# Patient Record
Sex: Female | Born: 1980 | Race: White | Hispanic: No | Smoking: Current every day smoker
Health system: Southern US, Community
[De-identification: ages and names within clinical notes are randomized; demographics above are authoritative.]

## PROBLEM LIST (undated history)

## (undated) DIAGNOSIS — N83209 Unspecified ovarian cyst, unspecified side: Secondary | ICD-10-CM

## (undated) DIAGNOSIS — I10 Essential (primary) hypertension: Secondary | ICD-10-CM

## (undated) DIAGNOSIS — N200 Calculus of kidney: Secondary | ICD-10-CM

## (undated) DIAGNOSIS — E669 Obesity, unspecified: Secondary | ICD-10-CM

## (undated) HISTORY — PX: TONSILLECTOMY: SUR1361

## (undated) HISTORY — PX: LAPAROSCOPY: SHX197

---

## 2016-04-25 DIAGNOSIS — G43909 Migraine, unspecified, not intractable, without status migrainosus: Secondary | ICD-10-CM | POA: Insufficient documentation

## 2016-04-25 DIAGNOSIS — F419 Anxiety disorder, unspecified: Secondary | ICD-10-CM | POA: Insufficient documentation

## 2016-04-25 DIAGNOSIS — F339 Major depressive disorder, recurrent, unspecified: Secondary | ICD-10-CM | POA: Insufficient documentation

## 2016-10-11 ENCOUNTER — Encounter (HOSPITAL_COMMUNITY): Payer: Self-pay | Admitting: Emergency Medicine

## 2016-10-11 DIAGNOSIS — N200 Calculus of kidney: Secondary | ICD-10-CM | POA: Insufficient documentation

## 2016-10-11 DIAGNOSIS — F17200 Nicotine dependence, unspecified, uncomplicated: Secondary | ICD-10-CM | POA: Insufficient documentation

## 2016-10-11 DIAGNOSIS — R109 Unspecified abdominal pain: Secondary | ICD-10-CM | POA: Insufficient documentation

## 2016-10-11 NOTE — ED Triage Notes (Signed)
Patient reports RLQ pain radiating to right lower back with emesis onset this afternoon , denies fever or diarrhea .

## 2016-10-12 ENCOUNTER — Emergency Department (HOSPITAL_COMMUNITY)
Admission: EM | Admit: 2016-10-12 | Discharge: 2016-10-12 | Disposition: A | Discharge status: Home / Self Care | Payer: Self-pay | Attending: Emergency Medicine | Admitting: Emergency Medicine

## 2016-10-12 ENCOUNTER — Emergency Department (HOSPITAL_COMMUNITY): Payer: Self-pay

## 2016-10-12 DIAGNOSIS — R109 Unspecified abdominal pain: Secondary | ICD-10-CM

## 2016-10-12 DIAGNOSIS — N200 Calculus of kidney: Secondary | ICD-10-CM

## 2016-10-12 DIAGNOSIS — R1031 Right lower quadrant pain: Secondary | ICD-10-CM

## 2016-10-12 HISTORY — DX: Obesity, unspecified: E66.9

## 2016-10-12 HISTORY — DX: Unspecified ovarian cyst, unspecified side: N83.209

## 2016-10-12 HISTORY — DX: Calculus of kidney: N20.0

## 2016-10-12 LAB — URINALYSIS, ROUTINE W REFLEX MICROSCOPIC
Bacteria, UA: NONE SEEN
Bilirubin Urine: NEGATIVE
GLUCOSE, UA: NEGATIVE mg/dL
Ketones, ur: 20 mg/dL — AB
Leukocytes, UA: NEGATIVE
NITRITE: NEGATIVE
Protein, ur: 100 mg/dL — AB
SPECIFIC GRAVITY, URINE: 1.019 (ref 1.005–1.030)
WBC, UA: NONE SEEN WBC/hpf (ref 0–5)
pH: 6 (ref 5.0–8.0)

## 2016-10-12 LAB — CBC
HCT: 43.8 % (ref 36.0–46.0)
Hemoglobin: 14.8 g/dL (ref 12.0–15.0)
MCH: 30.5 pg (ref 26.0–34.0)
MCHC: 33.8 g/dL (ref 30.0–36.0)
MCV: 90.3 fL (ref 78.0–100.0)
PLATELETS: 288 10*3/uL (ref 150–400)
RBC: 4.85 MIL/uL (ref 3.87–5.11)
RDW: 13.3 % (ref 11.5–15.5)
WBC: 11.4 10*3/uL — ABNORMAL HIGH (ref 4.0–10.5)

## 2016-10-12 LAB — COMPREHENSIVE METABOLIC PANEL
ALBUMIN: 4 g/dL (ref 3.5–5.0)
ALK PHOS: 80 U/L (ref 38–126)
ALT: 15 U/L (ref 14–54)
ANION GAP: 8 (ref 5–15)
AST: 22 U/L (ref 15–41)
BILIRUBIN TOTAL: 0.7 mg/dL (ref 0.3–1.2)
BUN: 11 mg/dL (ref 6–20)
CALCIUM: 9.3 mg/dL (ref 8.9–10.3)
CO2: 23 mmol/L (ref 22–32)
CREATININE: 0.78 mg/dL (ref 0.44–1.00)
Chloride: 107 mmol/L (ref 101–111)
GFR calc Af Amer: >60 mL/min (ref >60)
GFR calc non Af Amer: >60 mL/min (ref >60)
GLUCOSE: 106 mg/dL — AB (ref 65–99)
Potassium: 4.2 mmol/L (ref 3.5–5.1)
Sodium: 138 mmol/L (ref 135–145)
TOTAL PROTEIN: 7.2 g/dL (ref 6.5–8.1)

## 2016-10-12 LAB — I-STAT BETA HCG BLOOD, ED (MC, WL, AP ONLY)

## 2016-10-12 LAB — LIPASE, BLOOD: Lipase: 26 U/L (ref 11–51)

## 2016-10-12 MED ORDER — ONDANSETRON HCL 4 MG/2ML IJ SOLN
4.0000 mg | Freq: Once | INTRAMUSCULAR | Status: AC
  Administered 2016-10-12: 4 mg via INTRAVENOUS
  Filled 2016-10-12: qty 2

## 2016-10-12 MED ORDER — NAPROXEN 500 MG PO TABS: 500.0000 mg | ORAL_TABLET | Freq: Two times a day (BID) | ORAL | 0 refills | Status: DC

## 2016-10-12 MED ORDER — MORPHINE SULFATE (PF) 4 MG/ML IV SOLN
4.0000 mg | Freq: Once | INTRAVENOUS | Status: AC
  Administered 2016-10-12: 4 mg via INTRAVENOUS
  Filled 2016-10-12: qty 1

## 2016-10-12 MED ORDER — KETOROLAC TROMETHAMINE 30 MG/ML IJ SOLN
30.0000 mg | Freq: Once | INTRAMUSCULAR | Status: AC
  Administered 2016-10-12: 30 mg via INTRAVENOUS
  Filled 2016-10-12: qty 1

## 2016-10-12 MED ORDER — ONDANSETRON HCL 4 MG PO TABS: 4.0000 mg | ORAL_TABLET | Freq: Three times a day (TID) | ORAL | 0 refills | Status: DC | PRN

## 2016-10-12 MED ORDER — SODIUM CHLORIDE 0.9 % IV BOLUS (SEPSIS)
1000.0000 mL | Freq: Once | INTRAVENOUS | Status: AC
  Administered 2016-10-12: 1000 mL via INTRAVENOUS

## 2016-10-12 MED ORDER — HYDROCODONE-ACETAMINOPHEN 5-325 MG PO TABS: 1.0000 | ORAL_TABLET | Freq: Four times a day (QID) | ORAL | 0 refills | Status: DC | PRN

## 2016-10-12 MED ORDER — ONDANSETRON 4 MG PO TBDP
4.0000 mg | ORAL_TABLET | Freq: Once | ORAL | Status: AC
  Administered 2016-10-12: 4 mg via ORAL
  Filled 2016-10-12: qty 1

## 2016-10-12 MED ORDER — HYDROCODONE-ACETAMINOPHEN 5-325 MG PO TABS
1.0000 | ORAL_TABLET | Freq: Once | ORAL | Status: AC
  Administered 2016-10-12: 1 via ORAL
  Filled 2016-10-12: qty 1

## 2016-10-12 NOTE — Discharge Instructions (Addendum)
It was my pleasure taking care of you today!   You have been diagnosed with kidney stones. Drink plenty of fluids to help you pass the stone.  Take naproxen as directed with food for mild to moderate pain. Use your pain medication as directed and only as needed for severe pain.   Follow up with your primary care doctor in regards to your hospital visit.   Return to the ED immediately if you develop fever that persists > 101, uncontrolled pain or vomiting, or other concerns.   Do not drink alcohol, drive or participate in any other potentially dangerous activities while taking opiate pain medication as it may make you sleepy. Do not take this medication with any other sedating medications, either prescription or over-the-counter. If you were prescribed Percocet or Vicodin, do not take these with acetaminophen (Tylenol) as it is already contained within these medications.   This medication is an opiate (or narcotic) pain medication and can be habit forming.  Use it as little as possible to achieve adequate pain control.  Do not use or use it with extreme caution if you have a history of opiate abuse or dependence. This medication is intended for your use only - do not give any to anyone else and keep it in a secure place where nobody else, especially children, have access to it. It will also cause or worsen constipation, so you may want to consider taking an over-the-counter stool softener while you are taking this medication.

## 2016-10-12 NOTE — ED Notes (Signed)
Patient transported to CT 

## 2016-10-12 NOTE — ED Provider Notes (Signed)
Care assumed from previous provider PA Law. Please see note for further details. Briefly, patient is a 36 y.o. female who presented to ER for acute onset of right lower quadrant pain radiating to flank. Pelvic exam performed by prior provider which was unremarkable. Patient gets routine screening and declined wet prep / G&C. Small amount of clear discharge and no adnexal or cervical motion tenderness. Case discussed, plan agreed upon. Labs and urine reassuring. Clinical concern for kidney stone. Will follow up on pending CT and reevaluate.   CT reviewed showing 3 mm right ureteropelvic junction stone with mild hydronephrosis. Patient reevaluated and updated on CT findings. Patient states that pain and nausea are beginning to return. Transitioned to oral pain/nausea control. On reevaluation, patient's pain and nausea are controlled. She feels comfortable with discharge to home. Discussed reasons to return to ER in home care instructions. All questions answered.   Mcihael Hinderman, Chase PicketJaime Pilcher, PA-C 10/12/16 [phone]    Bethann BerkshireZammit, Joseph, MD 10/12/16 1536

## 2016-10-12 NOTE — ED Provider Notes (Signed)
[facility]-EMERGENCY DEPT Provider Note   CSN: [id number] Arrival date & time: 10/11/16  2321     History   Chief Complaint Chief Complaint  Patient presents with  . Abdominal Pain    HPI Karen Parks is a 36 y.o. female with history of kidney stone, ovarian cysts who presents with acute onset right lower quadrant and right flank pain that began this afternoon. Patient reports she was laying down watching flex when her pain started. She reports a severe sharp pain that is worse with movement. Patient has had associated blood in her urine. She has also had associated nausea and vomiting. She has tried to take ibuprofen at home prior to arrival, but vomited it up. She reports having 1 kidney stone in the past and notes that this pain is in a little bit different location, but cannot qualify this pain is similar. Patient denies any fevers, chest pain, shortness of breath, diarrhea, constipation, abnormal vaginal bleeding or discharge. Patient is not currently on her menstrual cycle and does not get a regular cycle and she has an IUD.  HPI  Past Medical History:  Diagnosis Date  . Kidney stone   . Obesity   . Obesity   . Ovarian cyst     There are no active problems to display for this patient.   Past Surgical History:  Procedure Laterality Date  . LAPAROSCOPY    . TONSILLECTOMY      OB History    No data available       Home Medications    Prior to Admission medications   Not on File    Family History No family history on file.  Social History Social History  Substance Use Topics  . Smoking status: Current Every Day Smoker  . Smokeless tobacco: Never Used  . Alcohol use Yes     Allergies   Patient has no known allergies.   Review of Systems Review of Systems  Constitutional: Negative for chills and fever.  HENT: Negative for facial swelling and sore throat.   Respiratory: Negative for shortness of breath.   Cardiovascular: Negative for chest pain.    Gastrointestinal: Positive for abdominal pain, nausea and vomiting.  Genitourinary: Positive for flank pain and hematuria. Negative for dysuria.  Musculoskeletal: Positive for back pain.  Skin: Negative for rash and wound.  Neurological: Negative for headaches.  Psychiatric/Behavioral: The patient is not nervous/anxious.      Physical Exam Updated Vital Signs BP (!) 154/93 (BP Location: Right Arm)   Pulse 97   Temp 98 F (36.7 C) (Oral)   Resp 18   SpO2 100%   Physical Exam  Constitutional: She appears well-developed and well-nourished. No distress.  HENT:  Head: Normocephalic and atraumatic.  Mouth/Throat: Oropharynx is clear and moist. No oropharyngeal exudate.  Eyes: Conjunctivae are normal. Pupils are equal, round, and reactive to light. Right eye exhibits no discharge. Left eye exhibits no discharge. No scleral icterus.  Neck: Normal range of motion. Neck supple. No thyromegaly present.  Cardiovascular: Normal rate, regular rhythm, normal heart sounds and intact distal pulses.  Exam reveals no gallop and no friction rub.   No murmur heard. Pulmonary/Chest: Effort normal and breath sounds normal. No stridor. No respiratory distress. She has no wheezes. She has no rales.  Abdominal: Soft. Bowel sounds are normal. She exhibits no distension. There is tenderness in the right lower quadrant. There is CVA tenderness (R). There is no rebound, no guarding and negative Murphy's sign.  Genitourinary: Cervix exhibits no motion tenderness. Right adnexum displays no mass, no tenderness and no fullness. Left adnexum displays no mass, no tenderness and no fullness. No bleeding in the vagina. Vaginal discharge (white/clear; probable normal, physiologic) found.  Genitourinary Comments: Chaperone present; IUD strings visualized at cervical os  Musculoskeletal: She exhibits no edema.  Lymphadenopathy:    She has no cervical adenopathy.  Neurological: She is alert. Coordination normal.   Skin: Skin is warm and dry. No rash noted. She is not diaphoretic. No pallor.  Psychiatric: She has a normal mood and affect.  Nursing note and vitals reviewed.    ED Treatments / Results  Labs (all labs ordered are listed, but only abnormal results are displayed) Labs Reviewed  COMPREHENSIVE METABOLIC PANEL - Abnormal; Notable for the following:       Result Value   Glucose, Bld 106 (*)    All other components within normal limits  CBC - Abnormal; Notable for the following:    WBC 11.4 (*)    All other components within normal limits  URINALYSIS, ROUTINE W REFLEX MICROSCOPIC - Abnormal; Notable for the following:    APPearance HAZY (*)    Hgb urine dipstick LARGE (*)    Ketones, ur 20 (*)    Protein, ur 100 (*)    Squamous Epithelial / LPF 0-5 (*)    All other components within normal limits  LIPASE, BLOOD  I-STAT BETA HCG BLOOD, ED ([facility], [facility], [facility] ONLY)    EKG  EKG Interpretation None       Radiology No results found.  Procedures Procedures (including critical care time)  Medications Ordered in ED Medications  sodium chloride 0.9 % bolus 1,000 mL (1,000 mLs Intravenous New Bag/Given 10/12/16 0541)  morphine 4 MG/ML injection 4 mg (4 mg Intravenous Given 10/12/16 0541)  ondansetron (ZOFRAN) injection 4 mg (4 mg Intravenous Given 10/12/16 0541)     Initial Impression / Assessment and Plan / ED Course  I have reviewed the triage vital signs and the nursing notes.  Pertinent labs & imaging results that were available during my care of the patient were reviewed by me and considered in my medical decision making (see chart for details).     Patient with right lower quadrant and right flank pain. UA shows large hematuria. CBC shows WBC 11.4. CMP WNL. Lipase 26. Pregnancy negative. Patient offered STD testing and other swabs on pelvic exam, however she declined and states she gets tested regularly and has had no changes in discharge. I feel this is reasonable, as patient  most likely suffering kidney stone. CT renal stone study pending. At shift change, [facility] PA-C for continued evaluation, follow up of CT renal stone study and determination of disposition. I discussed patient case with Dr. Elesa MassedWard who guided the patient's management and agrees with plan.   Final Clinical Impressions(s) / ED Diagnoses   Final diagnoses:  Right lower quadrant abdominal pain  Right flank pain    New Prescriptions New Prescriptions   No medications on file     Emi HolesLaw, Drakkar Medeiros M, Cordelia Poche-C 10/12/16 [id number]    Ward, Layla MawKristen N, DO 10/12/16 [phone]

## 2018-11-25 IMAGING — CT CT RENAL STONE PROTOCOL
2 of 4 series · 16 of 46 positions shown, 18 images · non-contrast
Comparison: None.

CLINICAL DATA: Right lower quadrant pain

EXAM:
CT ABDOMEN AND PELVIS WITHOUT CONTRAST
TECHNIQUE: Multidetector CT imaging of the abdomen and pelvis was performed
following the standard protocol without IV contrast.

[Series 4: renal stone 5.0 · axial · 0.74mm/px · z∈[+952,+1392]mm · 13 of 96 slices shown, 15 images]
[im 4/96  soft-tissue]
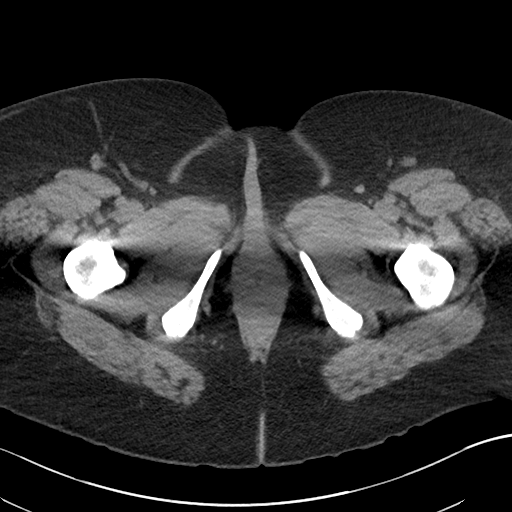
[im 4/96  bone]
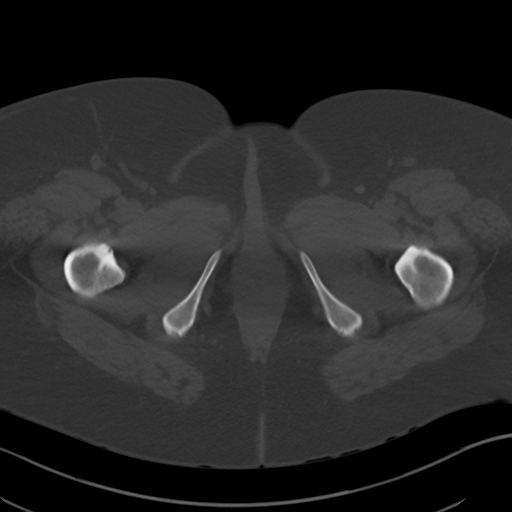
[im 12/96  soft-tissue]
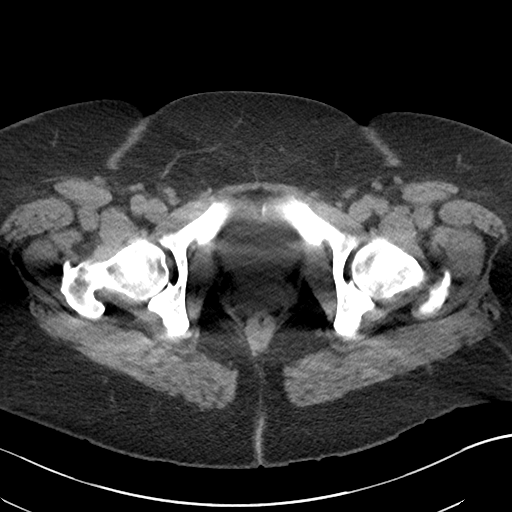
[im 20/96  soft-tissue]
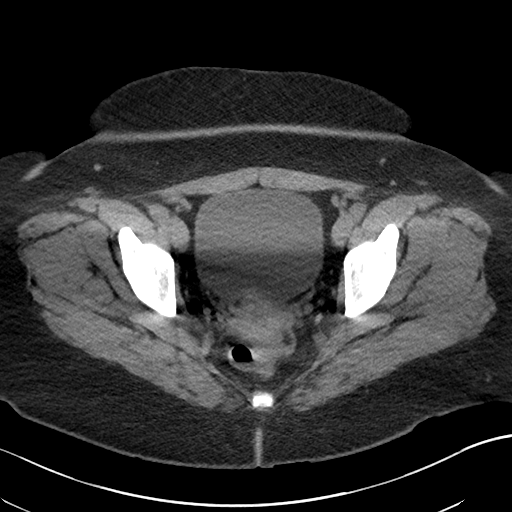
[im 27/96  soft-tissue]
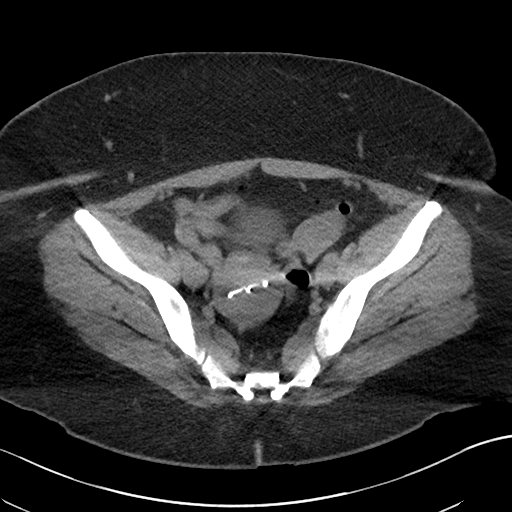
[im 35/96  soft-tissue]
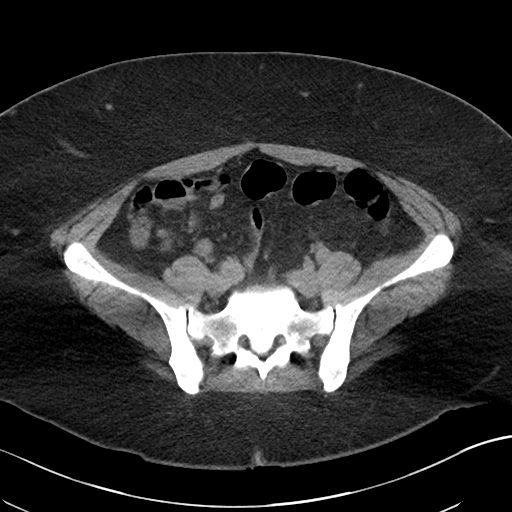
[im 42/96  soft-tissue]
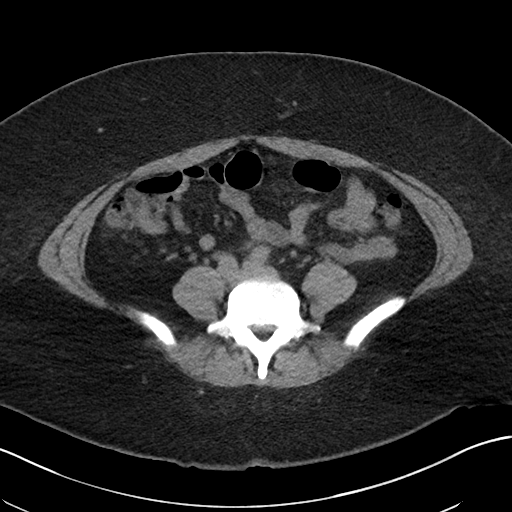
[im 50/96  soft-tissue]
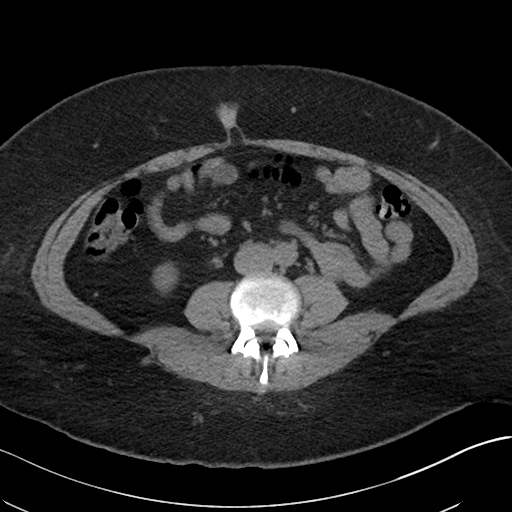
[im 54/96  soft-tissue]
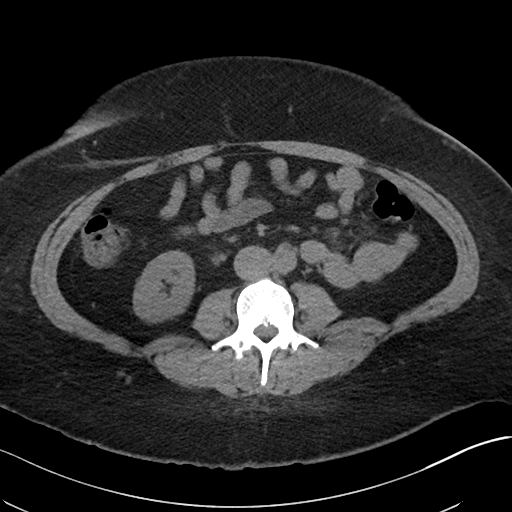
[im 61/96  soft-tissue]
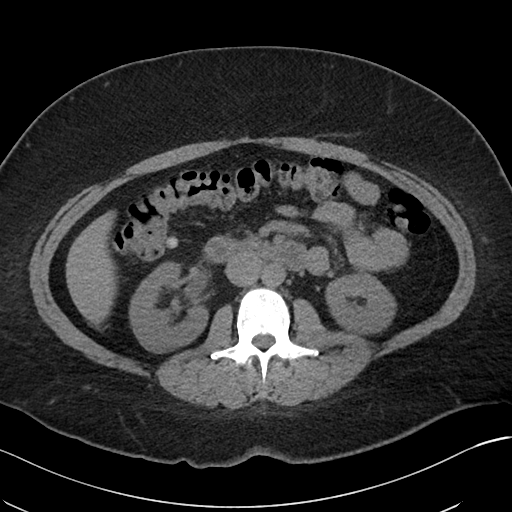
[im 61/96  bone]
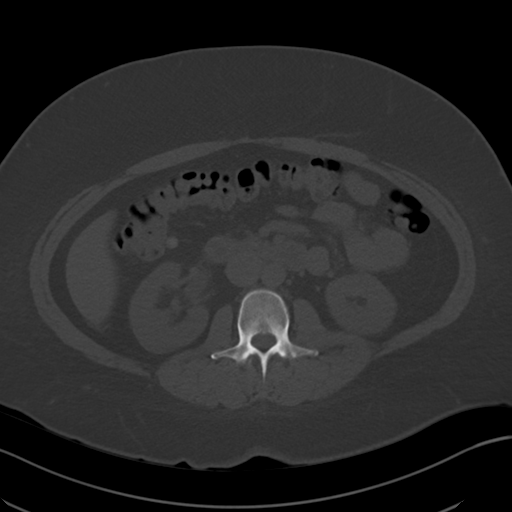
[im 69/96  soft-tissue]
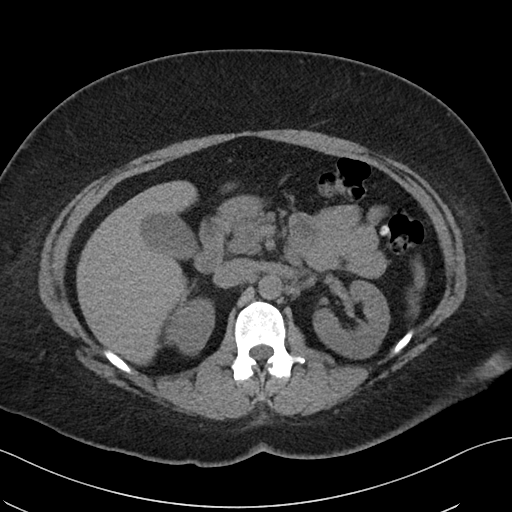
[im 77/96  soft-tissue]
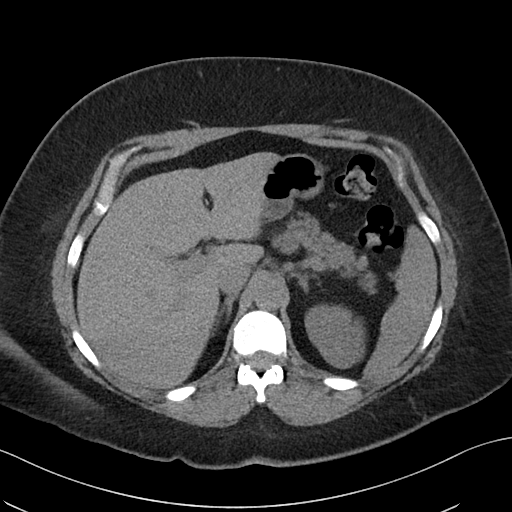
[im 84/96  soft-tissue]
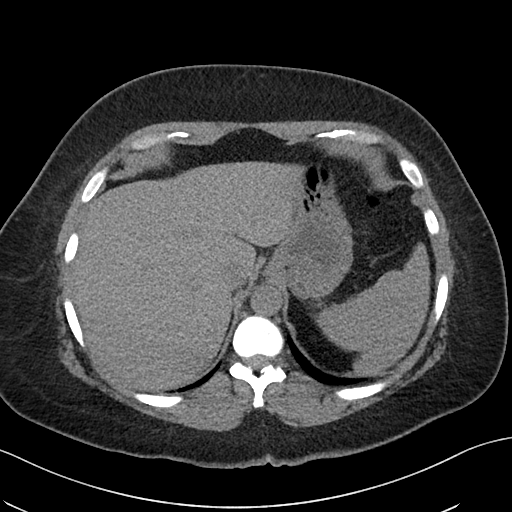
[im 92/96  soft-tissue]
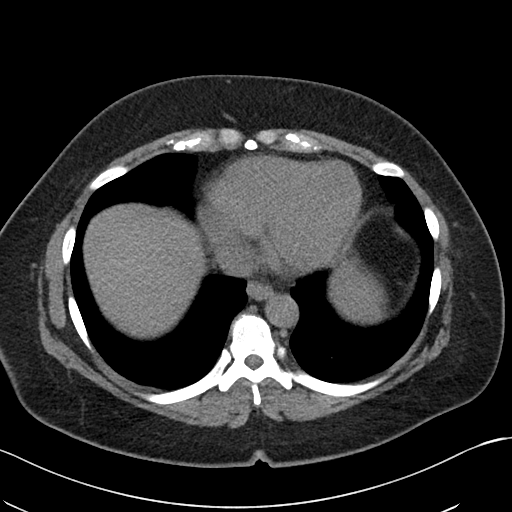

[Series 5: renal stone 3.0 cor · coronal · 0.72mm/px · 3 of 101 slices shown]
[im 34/101  soft-tissue]
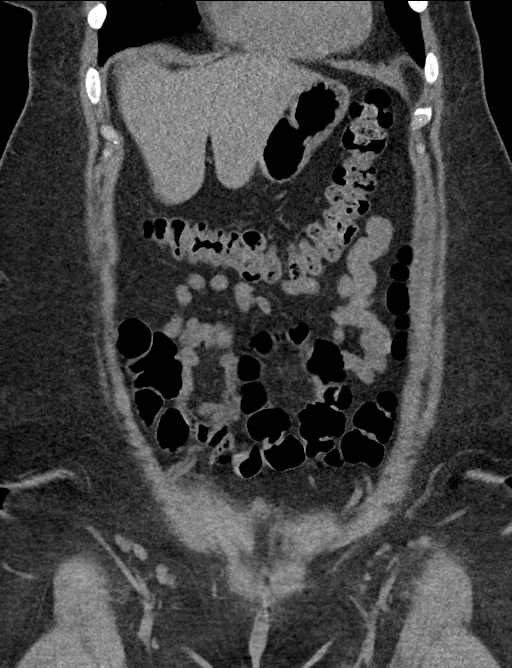
[im 45/101  soft-tissue]
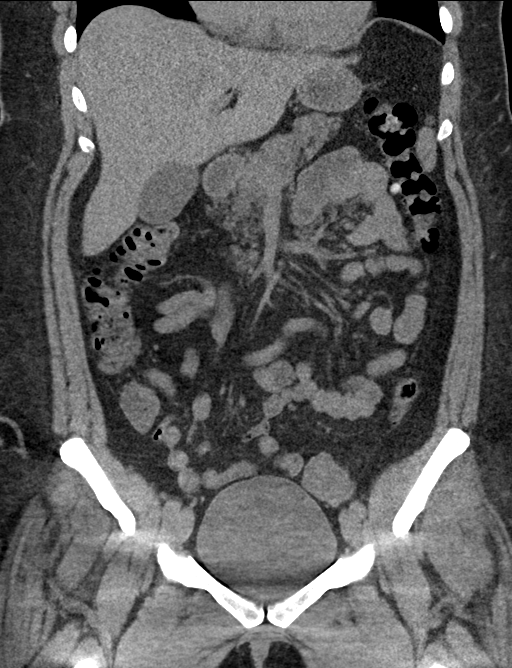
[im 56/101  soft-tissue]
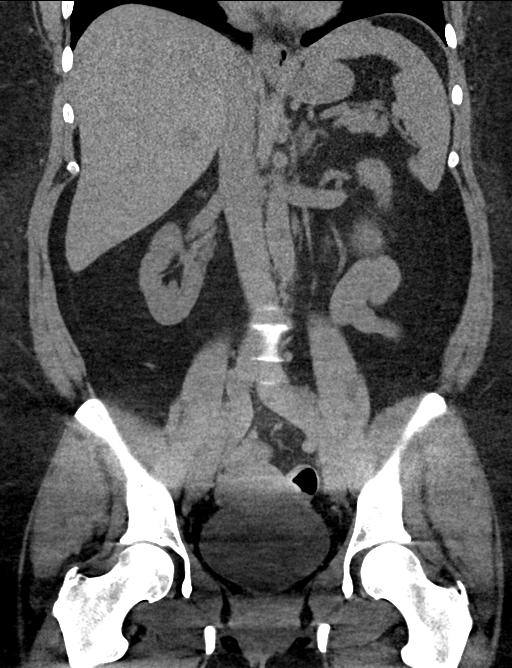

[16 of 46 positions shown; findings below may reference images not displayed]

FINDINGS: Lower chest: No acute abnormality.

Hepatobiliary: Unremarkable

Pancreas: Unremarkable

Spleen: Unremarkable

Adrenals/Urinary Tract: There is a 3 mm calculus at the right
ureteropelvic junction associated with mild right hydronephrosis and
perinephric stranding. There is a tiny calculus in the upper pole of
the right kidney on image 34. Left kidney is unremarkable. Adrenal
glands are unremarkable. Bladder is unremarkable.

Stomach/Bowel: Normal appendix. Diverticulosis is present in all
portions of the colon to a mild degree. No evidence of small-bowel
obstruction. Stomach is unremarkable.

Vascular/Lymphatic: No evidence of aortic aneurysm. No obvious
retroperitoneal adenopathy.

Reproductive: IUD is in place within the uterus. Adnexa are
unremarkable.

Other: No free-fluid.

Musculoskeletal: Mild L5-S1 degenerative disc disease. No vertebral
compression deformity.
IMPRESSION: 3 mm right ureteropelvic junction calculus is associated with mild
secondary findings of right ureteral obstruction.

Right nephrolithiasis.

Normal appendix.

Mild degenerative disc disease at L5-S1.

## 2018-12-29 ENCOUNTER — Other Ambulatory Visit: Payer: Self-pay

## 2018-12-30 ENCOUNTER — Encounter: Payer: Self-pay | Admitting: Family Medicine

## 2018-12-30 ENCOUNTER — Ambulatory Visit (INDEPENDENT_AMBULATORY_CARE_PROVIDER_SITE_OTHER): Payer: 59 | Admitting: Family Medicine

## 2018-12-30 VITALS — BP 138/80 | HR 100 | Ht 66.0 in | Wt 338.0 lb

## 2018-12-30 DIAGNOSIS — F331 Major depressive disorder, recurrent, moderate: Secondary | ICD-10-CM

## 2018-12-30 DIAGNOSIS — Z23 Encounter for immunization: Secondary | ICD-10-CM

## 2018-12-30 MED ORDER — CLONAZEPAM 1 MG PO TABS: 1.0000 mg | ORAL_TABLET | Freq: Two times a day (BID) | ORAL | 1 refills | Status: DC | PRN

## 2018-12-30 MED ORDER — SERTRALINE HCL 50 MG PO TABS: 25.0000 mg | ORAL_TABLET | Freq: Every day | ORAL | 3 refills | Status: DC

## 2018-12-30 NOTE — Patient Instructions (Signed)
It was great to meet you today!  If you are interested in counseling/therapy I would recommend Zella Ball, Psy.D with [phone]  Let's plan to follow up in about 2 weeks.  Please call of send mychart message if you have questions.

## 2018-12-30 NOTE — Progress Notes (Signed)
Karen Parks - 38 y.o. female MRN [id number]  Date of birth: 05-20-1980  Subjective Chief Complaint  Patient presents with  . Establish Care    HPI Karen Parks is a 38 y.o. female with history of depression and anxiety and hypertension here today for initial visit.  She reports that depression and anxiety are not well controlled at this time.  States that the past year has been very turbulent for her.  Reports that she purchased a house last fall and lost her job a few months later.  She has since gotten another job but is miserable in her current role.  She also ended a long term relationship at the end of last year which was difficult for her.  She was previously treated with sertraline and clonazepam as needed and this worked well for her.  She does have prior history of cutting behavior but has been able to abstain from this for 2 years. She has had increasing thoughts of cutting.  She has no prior suicide attempt but she does have occasional thoughts that she would be better off dead, she denies these at this time.  She has sought counseling in the past but has had mixed results from this.  She would be open to considering this again.    Depression screen [name] 2/9 12/30/2018  Decreased Interest 3  Down, Depressed, Hopeless 2  PHQ - 2 Score 5  Altered sleeping 3  Tired, decreased energy 2  Change in appetite 2  Feeling bad or failure about yourself  3  Trouble concentrating 2  Moving slowly or fidgety/restless 1  Suicidal thoughts 2  PHQ-9 Score 20    ROS:  A comprehensive ROS was completed and negative except as noted per HPI  No flowsheet data found.    Allergies  Allergen Reactions  . Ampicillin Rash  . Codeine Rash    Past Medical History:  Diagnosis Date  . Kidney stone   . Obesity   . Obesity   . Ovarian cyst     Past Surgical History:  Procedure Laterality Date  . LAPAROSCOPY    . TONSILLECTOMY      Social History   Socioeconomic History  . Marital  status: Single    Spouse name: Not on file  . Number of children: Not on file  . Years of education: Not on file  . Highest education level: Not on file  Occupational History  . Not on file  Social Needs  . Financial resource strain: Not on file  . Food insecurity    Worry: Not on file    Inability: Not on file  . Transportation needs    Medical: Not on file    Non-medical: Not on file  Tobacco Use  . Smoking status: Current Every Day Smoker  . Smokeless tobacco: Never Used  Substance and Sexual Activity  . Alcohol use: Yes  . Drug use: No  . Sexual activity: Not on file  Lifestyle  . Physical activity    Days per week: Not on file    Minutes per session: Not on file  . Stress: Not on file  Relationships  . Social Musician on phone: Not on file    Gets together: Not on file    Attends religious service: Not on file    Active member of club or organization: Not on file    Attends meetings of clubs or organizations: Not on file    Relationship status: Not  on file  Other Topics Concern  . Not on file  Social History Narrative  . Not on file    No family history on file.  Health Maintenance  Topic Date Due  . HIV Screening  04/06/1994  . TETANUS/TDAP  04/06/1998  . INFLUENZA VACCINE  11/05/2018  . PAP SMEAR-Modifier  09/14/2019    ----------------------------------------------------------------------------------------------------------------------------------------------------------------------------------------------------------------- Physical Exam BP 138/80   Pulse 100   Ht 5\' 6"  (1.676 m)   Wt (!) 338 lb (153.3 kg)   SpO2 98%   BMI 54.55 kg/m   Physical Exam Constitutional:      Appearance: Normal appearance.  HENT:     Head: Normocephalic and atraumatic.  Eyes:     General: No scleral icterus. Neurological:     General: No focal deficit present.     Mental Status: She is alert.  Psychiatric:        Thought Content: Thought content  normal.        Judgment: Judgment normal.     Comments: Tearful with depressed affect.  Speech normal.      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  She has done well with sertraline previously, will restart this at 25mg  initially and plan to titrate at f/u in tolerating well.  -Discussed seeing a therapist which she was open to, given name of a few recommendations.  -Will add clonazepam back on as well for anxiety.  -Denies SI at this time. She also thinks she will be able to avoid cutting behaviors with adding back on medication  Return in about 2 weeks (around 01/13/2019) for Depression with anxiety.    >45 minutes spent with patient with 50% of time spent dedicated to counseling and coordination of care as outlined above.

## 2018-12-30 NOTE — Assessment & Plan Note (Addendum)
-  Increased depressive symptoms at this time.  She has done well with sertraline previously, will restart this at 25mg  initially and plan to titrate at f/u in tolerating well.  -Discussed seeing a therapist which she was open to, given name of a few recommendations.  -Will add clonazepam back on as well for anxiety.  -Denies SI at this time. She also thinks she will be able to avoid cutting behaviors with adding back on medication  Return in about 2 weeks (around 01/13/2019) for Depression with anxiety.

## 2019-06-16 ENCOUNTER — Ambulatory Visit: Payer: Self-pay | Attending: Internal Medicine

## 2019-06-16 DIAGNOSIS — Z23 Encounter for immunization: Secondary | ICD-10-CM

## 2019-06-16 NOTE — Progress Notes (Signed)
   Covid-19 Vaccination Clinic  Name:  Karen Parks    MRN: [id number] DOB: 1980-09-29  06/16/2019  [name] was observed post Covid-19 immunization for 15 minutes without incident. She was provided with Vaccine Information Sheet and instruction to access the V-Safe system.   [name] was instructed to call 911 with any severe reactions post vaccine: Marland Kitchen Difficulty breathing  . Swelling of face and throat  . A fast heartbeat  . A bad rash all over body  . Dizziness and weakness   Immunizations Administered    Name Date Dose VIS Date Route   Pfizer COVID-19 Vaccine 06/16/2019  8:15 AM 0.3 mL 03/17/2019 Intramuscular   Manufacturer: [employer], [employer]   Lot: [id number]   NDC: [address]-0

## 2019-07-10 ENCOUNTER — Ambulatory Visit: Payer: Self-pay | Attending: Internal Medicine

## 2019-07-10 ENCOUNTER — Ambulatory Visit: Payer: Self-pay

## 2019-07-10 DIAGNOSIS — Z23 Encounter for immunization: Secondary | ICD-10-CM

## 2019-07-10 NOTE — Progress Notes (Signed)
   Covid-19 Vaccination Clinic  Name:  Karen Parks    MRN: [id number] DOB: July 31, 1980  07/10/2019  [name] was observed post Covid-19 immunization for 15 minutes without incident. She was provided with Vaccine Information Sheet and instruction to access the V-Safe system.   [name] was instructed to call 911 with any severe reactions post vaccine: Marland Kitchen Difficulty breathing  . Swelling of face and throat  . A fast heartbeat  . A bad rash all over body  . Dizziness and weakness   Immunizations Administered    Name Date Dose VIS Date Route   Pfizer COVID-19 Vaccine 07/10/2019  5:22 PM 0.3 mL 03/17/2019 Intramuscular   Manufacturer: [employer], [employer]   Lot: [id number]   NDC: [address]-9

## 2019-07-27 ENCOUNTER — Ambulatory Visit (INDEPENDENT_AMBULATORY_CARE_PROVIDER_SITE_OTHER): Payer: Self-pay | Admitting: Family Medicine

## 2019-07-27 ENCOUNTER — Encounter: Payer: Self-pay | Admitting: Family Medicine

## 2019-07-27 ENCOUNTER — Other Ambulatory Visit: Payer: Self-pay

## 2019-07-27 VITALS — BP 179/117 | HR 84 | Temp 98.5°F | Ht 67.0 in | Wt 351.0 lb

## 2019-07-27 DIAGNOSIS — R030 Elevated blood-pressure reading, without diagnosis of hypertension: Secondary | ICD-10-CM

## 2019-07-27 DIAGNOSIS — L03011 Cellulitis of right finger: Secondary | ICD-10-CM

## 2019-07-27 DIAGNOSIS — L0291 Cutaneous abscess, unspecified: Secondary | ICD-10-CM

## 2019-07-27 MED ORDER — AMLODIPINE BESYLATE 5 MG PO TABS: 5.0000 mg | ORAL_TABLET | Freq: Every day | ORAL | 0 refills | Status: DC

## 2019-07-27 NOTE — Assessment & Plan Note (Signed)
I&D completed today.  Overall tolerated well.  Complete course of bactrim.

## 2019-07-27 NOTE — Patient Instructions (Addendum)
Keep band aid on until this evening.  You may continue antibiotic ointment over area.  Complete course of bactrim.   Let me know if this continues to worsen.    Your Blood pressure remains elevated.   I would like to start you on a low dose blood pressure medication called amlodipine.   I have sent this in for you.  See me again in 2 weeks for recheck.

## 2019-07-27 NOTE — Assessment & Plan Note (Signed)
Blood pressure is quite high today upon initial check.  Recheck with reading of 179/117.  Will start amlodipine 5mg  daily with return in 2 weeks.

## 2019-07-27 NOTE — Addendum Note (Signed)
Addended byArvilla Market on: 07/27/2019 01:08 PM   Modules accepted: Orders

## 2019-07-27 NOTE — Progress Notes (Signed)
Karen Parks - 39 y.o. female MRN [id number]  Date of birth: 1980/06/03  Subjective Chief Complaint  Patient presents with  . Hand Pain    HPI Karen Parks is a 39 y.o. female with history of depression and anxiety here today with complaint of finger pain.  Reports that she got a paper cut a few days ago.  Noticed increased redness and swelling a day later and was seen at urgent care.  She was started on bactrim and she has taken x1 day.  Has had continued swelling with worsening pain.  She denies fever, chills, or flu like symptoms.    Allergies  Allergen Reactions  . Ampicillin Rash  . Codeine Rash    Past Medical History:  Diagnosis Date  . Kidney stone   . Obesity   . Obesity   . Ovarian cyst     Past Surgical History:  Procedure Laterality Date  . LAPAROSCOPY    . TONSILLECTOMY      Social History   Socioeconomic History  . Marital status: Single    Spouse name: Not on file  . Number of children: Not on file  . Years of education: Not on file  . Highest education level: Not on file  Occupational History  . Not on file  Tobacco Use  . Smoking status: Current Every Day Smoker  . Smokeless tobacco: Never Used  Substance and Sexual Activity  . Alcohol use: Yes  . Drug use: No  . Sexual activity: Not on file  Other Topics Concern  . Not on file  Social History Narrative  . Not on file   Social Determinants of Health   Financial Resource Strain:   . Difficulty of Paying Living Expenses:   Food Insecurity:   . Worried About Charity fundraiser in the Last Year:   . Arboriculturist in the Last Year:   Transportation Needs:   . Film/video editor (Medical):   Marland Kitchen Lack of Transportation (Non-Medical):   Physical Activity:   . Days of Exercise per Week:   . Minutes of Exercise per Session:   Stress:   . Feeling of Stress :   Social Connections:   . Frequency of Communication with Friends and Family:   . Frequency of Social Gatherings with Friends and  Family:   . Attends Religious Services:   . Active Member of Clubs or Organizations:   . Attends Archivist Meetings:   Marland Kitchen Marital Status:     History reviewed. No pertinent family history.  Health Maintenance  Topic Date Due  . HIV Screening  Never done  . TETANUS/TDAP  Never done  . PAP SMEAR-Modifier  09/14/2019  . INFLUENZA VACCINE  11/05/2019  . COVID-19 Vaccine  Completed     ----------------------------------------------------------------------------------------------------------------------------------------------------------------------------------------------------------------- Physical Exam BP (!) 207/119 (BP Location: Left Arm, Patient Position: Sitting, Cuff Size: Large)   Pulse 80   Temp 98.5 F (36.9 C) (Oral)   Ht 5\' 7"  (1.702 m)   Wt (!) 351 lb 0.6 oz (159.2 kg)   SpO2 98%   BMI 54.98 kg/m   Physical Exam Constitutional:      Appearance: Normal appearance.  HENT:     Head: Normocephalic and atraumatic.  Cardiovascular:     Rate and Rhythm: Normal rate and regular rhythm.  Pulmonary:     Effort: Pulmonary effort is normal.     Breath sounds: Normal breath sounds.  Skin:    Comments: Distal index finger on  R hand with swelling, erythema and tenderness just distal to DIP.  There is some fluctuance present.    Neurological:     General: No focal deficit present.     Mental Status: She is alert.  Psychiatric:        Mood and Affect: Mood normal.        Behavior: Behavior normal.   Procedure note:  Verbal consent obtained.  Discussed alternatives to procedure.  Reviewed potential complications including bleeding, infection, vascular compromise. She agrees to proceed and verbal consent given.  She was prepped with chlorhexidine and digital block performed using 1% lidocaine.  ~4.2mL total used.  After adequate anesthesia.  3 small puncture wound were made in the abscess using a #11 blade.  Bloody, purulent material expressed from wound.  Culture  taken.  She tolerated procedure well.  No immediate complications. Wound covered with band-aid.  Post procedure instructions given.   ------------------------------------------------------------------------------------------------------------------------------------------------------------------------------------------------------------------- Assessment and Plan  Abscess around fingernail of right hand I&D completed today.  Overall tolerated well.  Complete course of bactrim.    Elevated blood pressure reading Blood pressure is quite high today upon initial check.  Recheck with reading of 179/117.  Will start amlodipine 5mg  daily with return in 2 weeks.     Meds ordered this encounter  Medications  . amLODipine (NORVASC) 5 MG tablet    Sig: Take 1 tablet (5 mg total) by mouth daily.    Dispense:  90 tablet    Refill:  0    No follow-ups on file.    This visit occurred during the SARS-CoV-2 public health emergency.  Safety protocols were in place, including screening questions prior to the visit, additional usage of staff PPE, and extensive cleaning of exam room while observing appropriate contact time as indicated for disinfecting solutions.

## 2019-07-30 LAB — WOUND CULTURE
GRAM STAIN:: NONE SEEN
MICRO NUMBER:: 10395610
RESULT:: NO GROWTH
SPECIMEN QUALITY:: ADEQUATE

## 2019-08-10 ENCOUNTER — Ambulatory Visit (INDEPENDENT_AMBULATORY_CARE_PROVIDER_SITE_OTHER): Payer: Self-pay | Admitting: Family Medicine

## 2019-08-10 ENCOUNTER — Other Ambulatory Visit: Payer: Self-pay

## 2019-08-10 ENCOUNTER — Encounter: Payer: Self-pay | Admitting: Family Medicine

## 2019-08-10 DIAGNOSIS — R030 Elevated blood-pressure reading, without diagnosis of hypertension: Secondary | ICD-10-CM

## 2019-08-10 MED ORDER — CLONAZEPAM 1 MG PO TABS: 1.0000 mg | ORAL_TABLET | Freq: Two times a day (BID) | ORAL | 1 refills | Status: DC | PRN

## 2019-08-10 NOTE — Assessment & Plan Note (Signed)
BP remains elevated.  She would like to record BP at home and let me know readings over the next couple of weeks.  If this remains high we discussed adding new medication for BP back on.  I think beta blocker may be helpful as anxiety seems to be a contributor to her blood pressure.  Recommend low salt diet, given information on DASH diet.

## 2019-08-10 NOTE — Patient Instructions (Addendum)
Stop amlodipine Monitor BP at home. Send me a message with readings over the next couple of weeks.  Follow a low salt diet   If this remains high then we'll need to add additional medication   DASH Eating Plan DASH stands for "Dietary Approaches to Stop Hypertension." The DASH eating plan is a healthy eating plan that has been shown to reduce high blood pressure (hypertension). It may also reduce your risk for type 2 diabetes, heart disease, and stroke. The DASH eating plan may also help with weight loss. What are tips for following this plan?  General guidelines  Avoid eating more than 2,300 mg (milligrams) of salt (sodium) a day. If you have hypertension, you may need to reduce your sodium intake to 1,500 mg a day.  Limit alcohol intake to no more than 1 drink a day for nonpregnant women and 2 drinks a day for men. One drink equals 12 oz of beer, 5 oz of wine, or 1 oz of hard liquor.  Work with your health care provider to maintain a healthy body weight or to lose weight. Ask what an ideal weight is for you.  Get at least 30 minutes of exercise that causes your heart to beat faster (aerobic exercise) most days of the week. Activities may include walking, swimming, or biking.  Work with your health care provider or diet and nutrition specialist (dietitian) to adjust your eating plan to your individual calorie needs. Reading food labels   Check food labels for the amount of sodium per serving. Choose foods with less than 5 percent of the Daily Value of sodium. Generally, foods with less than 300 mg of sodium per serving fit into this eating plan.  To find whole grains, look for the word "whole" as the first word in the ingredient list. Shopping  Buy products labeled as "low-sodium" or "no salt added."  Buy fresh foods. Avoid canned foods and premade or frozen meals. Cooking  Avoid adding salt when cooking. Use salt-free seasonings or herbs instead of table salt or sea salt. Check  with your health care provider or pharmacist before using salt substitutes.  Do not fry foods. Cook foods using healthy methods such as baking, boiling, grilling, and broiling instead.  Cook with heart-healthy oils, such as olive, canola, soybean, or sunflower oil. Meal planning  Eat a balanced diet that includes: ? 5 or more servings of fruits and vegetables each day. At each meal, try to fill half of your plate with fruits and vegetables. ? Up to 6-8 servings of whole grains each day. ? Less than 6 oz of lean meat, poultry, or fish each day. A 3-oz serving of meat is about the same size as a deck of cards. One egg equals 1 oz. ? 2 servings of low-fat dairy each day. ? A serving of nuts, seeds, or beans 5 times each week. ? Heart-healthy fats. Healthy fats called Omega-3 fatty acids are found in foods such as flaxseeds and coldwater fish, like sardines, salmon, and mackerel.  Limit how much you eat of the following: ? Canned or prepackaged foods. ? Food that is high in trans fat, such as fried foods. ? Food that is high in saturated fat, such as fatty meat. ? Sweets, desserts, sugary drinks, and other foods with added sugar. ? Full-fat dairy products.  Do not salt foods before eating.  Try to eat at least 2 vegetarian meals each week.  Eat more home-cooked food and less restaurant, buffet, and fast  food.  When eating at a restaurant, ask that your food be prepared with less salt or no salt, if possible. What foods are recommended? The items listed may not be a complete list. Talk with your dietitian about what dietary choices are best for you. Grains Whole-grain or whole-wheat bread. Whole-grain or whole-wheat pasta. Brown rice. [place]. Bulgur. Whole-grain and low-sodium cereals. Pita bread. Low-fat, low-sodium crackers. Whole-wheat flour tortillas. Vegetables Fresh or frozen vegetables (raw, steamed, roasted, or grilled). Low-sodium or reduced-sodium tomato and vegetable  juice. Low-sodium or reduced-sodium tomato sauce and tomato paste. Low-sodium or reduced-sodium canned vegetables. Fruits All fresh, dried, or frozen fruit. Canned fruit in natural juice (without added sugar). Meat and other protein foods Skinless chicken or [place]. Ground chicken or [place]. Pork with fat trimmed off. Fish and seafood. Egg whites. Dried beans, peas, or lentils. Unsalted nuts, nut butters, and seeds. Unsalted canned beans. Lean cuts of beef with fat trimmed off. Low-sodium, lean deli meat. Dairy Low-fat (1%) or fat-free (skim) milk. Fat-free, low-fat, or reduced-fat cheeses. Nonfat, low-sodium ricotta or cottage cheese. Low-fat or nonfat yogurt. Low-fat, low-sodium cheese. Fats and oils Soft margarine without trans fats. Vegetable oil. Low-fat, reduced-fat, or light mayonnaise and salad dressings (reduced-sodium). Canola, safflower, olive, soybean, and sunflower oils. Avocado. Seasoning and other foods Herbs. Spices. Seasoning mixes without salt. Unsalted popcorn and pretzels. Fat-free sweets. What foods are not recommended? The items listed may not be a complete list. Talk with your dietitian about what dietary choices are best for you. Grains Baked goods made with fat, such as croissants, muffins, or some breads. Dry pasta or rice meal packs. Vegetables Creamed or fried vegetables. Vegetables in a cheese sauce. Regular canned vegetables (not low-sodium or reduced-sodium). Regular canned tomato sauce and paste (not low-sodium or reduced-sodium). Regular tomato and vegetable juice (not low-sodium or reduced-sodium). Angie Fava. Olives. Fruits Canned fruit in a light or heavy syrup. Fried fruit. Fruit in cream or butter sauce. Meat and other protein foods Fatty cuts of meat. Ribs. Fried meat. Berniece Salines. Sausage. Bologna and other processed lunch meats. Salami. Fatback. Hotdogs. Bratwurst. Salted nuts and seeds. Canned beans with added salt. Canned or smoked fish. Whole eggs or egg yolks.  Chicken or [place] with skin. Dairy Whole or 2% milk, cream, and half-and-half. Whole or full-fat cream cheese. Whole-fat or sweetened yogurt. Full-fat cheese. Nondairy creamers. Whipped toppings. Processed cheese and cheese spreads. Fats and oils Butter. Stick margarine. Lard. Shortening. Ghee. Bacon fat. Tropical oils, such as coconut, palm kernel, or palm oil. Seasoning and other foods Salted popcorn and pretzels. Onion salt, garlic salt, seasoned salt, table salt, and sea salt. Worcestershire sauce. Tartar sauce. Barbecue sauce. Teriyaki sauce. Soy sauce, including reduced-sodium. Steak sauce. Canned and packaged gravies. Fish sauce. Oyster sauce. Cocktail sauce. Horseradish that you find on the shelf. Ketchup. Mustard. Meat flavorings and tenderizers. Bouillon cubes. Hot sauce and Tabasco sauce. Premade or packaged marinades. Premade or packaged taco seasonings. Relishes. Regular salad dressings. Where to find more information:  National Heart, Lung, and [place]: https://wilson-eaton.com/  American Heart Association: www.heart.org Summary  The DASH eating plan is a healthy eating plan that has been shown to reduce high blood pressure (hypertension). It may also reduce your risk for type 2 diabetes, heart disease, and stroke.  With the DASH eating plan, you should limit salt (sodium) intake to 2,300 mg a day. If you have hypertension, you may need to reduce your sodium intake to 1,500 mg a day.  When on the DASH  eating plan, aim to eat more fresh fruits and vegetables, whole grains, lean proteins, low-fat dairy, and heart-healthy fats.  Work with your health care provider or diet and nutrition specialist (dietitian) to adjust your eating plan to your individual calorie needs. This information is not intended to replace advice given to you by your health care provider. Make sure you discuss any questions you have with your health care provider. Document Revised: 03/05/2017 Document Reviewed:  03/16/2016 Elsevier Patient Education  2020 [employer].

## 2019-08-10 NOTE — Progress Notes (Signed)
Karen Parks - 39 y.o. female MRN [id number]  Date of birth: 17-Jan-1981  Subjective Chief Complaint  Patient presents with  . Hypertension    HPI Karen Parks is a 39 y.o. female here today for follow up of HTN.  She was started on amlodipine at previous appointment.  BP has improved some today but she states that she does not like how she feels taking this.  States that she feels like she has had mood swings since starting this.  She would like to try off of medication and see if this improves how she is feeling.  She denies any symptoms related to her BP including chest pain, shortness of breath, palpitations, headache or vision changes.  She feels like anxiety contributes to her BP and is typically elevated a the doctors office.   ROS:  A comprehensive ROS was completed and negative except as noted per HPI  Allergies  Allergen Reactions  . Amlodipine     Mood swings   . Ampicillin Rash  . Bupropion Palpitations  . Codeine Rash    Past Medical History:  Diagnosis Date  . Kidney stone   . Obesity   . Obesity   . Ovarian cyst     Past Surgical History:  Procedure Laterality Date  . LAPAROSCOPY    . TONSILLECTOMY      Social History   Socioeconomic History  . Marital status: Widowed    Spouse name: Not on file  . Number of children: Not on file  . Years of education: Not on file  . Highest education level: Not on file  Occupational History  . Not on file  Tobacco Use  . Smoking status: Current Every Day Smoker    Types: Cigarettes  . Smokeless tobacco: Never Used  Substance and Sexual Activity  . Alcohol use: Yes  . Drug use: No  . Sexual activity: Not Currently  Other Topics Concern  . Not on file  Social History Narrative  . Not on file   Social Determinants of Health   Financial Resource Strain:   . Difficulty of Paying Living Expenses:   Food Insecurity:   . Worried About Charity fundraiser in the Last Year:   . Arboriculturist in the Last Year:    Transportation Needs:   . Film/video editor (Medical):   Marland Kitchen Lack of Transportation (Non-Medical):   Physical Activity:   . Days of Exercise per Week:   . Minutes of Exercise per Session:   Stress:   . Feeling of Stress :   Social Connections:   . Frequency of Communication with Friends and Family:   . Frequency of Social Gatherings with Friends and Family:   . Attends Religious Services:   . Active Member of Clubs or Organizations:   . Attends Archivist Meetings:   Marland Kitchen Marital Status:     No family history on file.  Health Maintenance  Topic Date Due  . PAP SMEAR-Modifier  09/14/2019  . TETANUS/TDAP  07/26/2020 (Originally 04/06/1998)  . INFLUENZA VACCINE  11/05/2019  . COVID-19 Vaccine  Completed  . HIV Screening  Discontinued     ----------------------------------------------------------------------------------------------------------------------------------------------------------------------------------------------------------------- Physical Exam BP (!) 159/91 (BP Location: Left Arm, Patient Position: Sitting, Cuff Size: Normal)   Pulse 79   Temp 97.8 F (36.6 C) (Temporal)   Ht 5' 6.93" (1.7 m)   Wt (!) 351 lb (159.2 kg)   SpO2 100%   BMI 55.09 kg/m   Physical Exam  Constitutional:      Appearance: Normal appearance.  HENT:     Head: Normocephalic and atraumatic.  Eyes:     General: No scleral icterus. Cardiovascular:     Rate and Rhythm: Normal rate and regular rhythm.  Pulmonary:     Effort: Pulmonary effort is normal.     Breath sounds: Normal breath sounds.  Musculoskeletal:     Cervical back: Neck supple.  Skin:    General: Skin is warm and dry.  Neurological:     General: No focal deficit present.     Mental Status: She is alert.  Psychiatric:        Mood and Affect: Mood normal.        Behavior: Behavior normal.      ------------------------------------------------------------------------------------------------------------------------------------------------------------------------------------------------------------------- Assessment and Plan  Elevated blood pressure reading BP remains elevated.  She would like to record BP at home and let me know readings over the next couple of weeks.  If this remains high we discussed adding new medication for BP back on.  I think beta blocker may be helpful as anxiety seems to be a contributor to her blood pressure.  Recommend low salt diet, given information on DASH diet.     Meds ordered this encounter  Medications  . clonazePAM (KLONOPIN) 1 MG tablet    Sig: Take 1 tablet (1 mg total) by mouth 2 (two) times daily as needed for anxiety.    Dispense:  60 tablet    Refill:  1    No follow-ups on file.    This visit occurred during the SARS-CoV-2 public health emergency.  Safety protocols were in place, including screening questions prior to the visit, additional usage of staff PPE, and extensive cleaning of exam room while observing appropriate contact time as indicated for disinfecting solutions.

## 2020-07-27 ENCOUNTER — Encounter: Payer: Self-pay | Admitting: Family Medicine

## 2020-08-07 ENCOUNTER — Encounter: Payer: Self-pay | Admitting: Family Medicine

## 2020-08-07 ENCOUNTER — Ambulatory Visit (INDEPENDENT_AMBULATORY_CARE_PROVIDER_SITE_OTHER): Payer: Self-pay | Admitting: Family Medicine

## 2020-08-07 ENCOUNTER — Other Ambulatory Visit: Payer: Self-pay

## 2020-08-07 VITALS — BP 179/130 | HR 94 | Ht 67.0 in | Wt 359.0 lb

## 2020-08-07 DIAGNOSIS — L03019 Cellulitis of unspecified finger: Secondary | ICD-10-CM

## 2020-08-07 DIAGNOSIS — I10 Essential (primary) hypertension: Secondary | ICD-10-CM

## 2020-08-07 MED ORDER — HYDROCODONE-ACETAMINOPHEN 5-325 MG PO TABS: 1.0000 | ORAL_TABLET | Freq: Four times a day (QID) | ORAL | 0 refills | Status: DC | PRN

## 2020-08-07 MED ORDER — DOXYCYCLINE HYCLATE 100 MG PO TABS: 100.0000 mg | ORAL_TABLET | Freq: Two times a day (BID) | ORAL | 0 refills | Status: DC

## 2020-08-07 MED ORDER — DOXYCYCLINE HYCLATE 100 MG PO TABS: 100.0000 mg | ORAL_TABLET | Freq: Two times a day (BID) | ORAL | 0 refills | Status: AC

## 2020-08-07 MED ORDER — LISINOPRIL 20 MG PO TABS: 20.0000 mg | ORAL_TABLET | Freq: Every day | ORAL | 3 refills | Status: DC

## 2020-08-07 NOTE — Assessment & Plan Note (Signed)
Start lisinopril 20mg .  Follow up in about 3 weeks for BP recheck and lab follow up for potassium and renal function.

## 2020-08-07 NOTE — Progress Notes (Signed)
Karen Parks - 40 y.o. female MRN [id number]  Date of birth: July 04, 1980  Subjective Chief Complaint  Patient presents with  . finger Infection    HPI Karen Parks is a 40 y.o. female here today with complaint of finger infection.    Started having pain and swelling of index finger or R hand about 2 weeks ago.  Seen at urgent care and started on bactrim and mupirocin with recommendations to do daily soaks.  Condition continued to worsen and she developed red streaking up her arm.  Seen at urgent care again on 07/30/20 and had I&D however was only able to get blood and not pockets of purulent material noted.   She was started on clindamycin at that time and streaking improved with some retreating of erythema.  The finger today continues to be swollen, tender, red and warm.  She denies fever or chills.   BP is also elevated.  She has taken amlodipine before but did not do well with this.  She does not currently have insurance so she would like something affordable.  Denies symptoms related to HTN.   ROS:  A comprehensive ROS was completed and negative except as noted per HPI  Allergies  Allergen Reactions  . Amlodipine     Mood swings   . Ampicillin Rash  . Bupropion Palpitations  . Codeine Rash    Past Medical History:  Diagnosis Date  . Kidney stone   . Obesity   . Obesity   . Ovarian cyst     Past Surgical History:  Procedure Laterality Date  . LAPAROSCOPY    . TONSILLECTOMY      Social History   Socioeconomic History  . Marital status: Widowed    Spouse name: Not on file  . Number of children: Not on file  . Years of education: Not on file  . Highest education level: Not on file  Occupational History  . Not on file  Tobacco Use  . Smoking status: Current Every Day Smoker    Types: Cigarettes  . Smokeless tobacco: Never Used  Vaping Use  . Vaping Use: Never used  Substance and Sexual Activity  . Alcohol use: Yes  . Drug use: No  . Sexual activity: Not Currently   Other Topics Concern  . Not on file  Social History Narrative  . Not on file   Social Determinants of Health   Financial Resource Strain: Not on file  Food Insecurity: Not on file  Transportation Needs: Not on file  Physical Activity: Not on file  Stress: Not on file  Social Connections: Not on file    History reviewed. No pertinent family history.  Health Maintenance  Topic Date Due  . Hepatitis C Screening  Never done  . TETANUS/TDAP  Never done  . PAP SMEAR-Modifier  09/14/2019  . COVID-19 Vaccine (3 - Booster for Pfizer series) 01/09/2020  . INFLUENZA VACCINE  11/04/2020  . HPV VACCINES  Aged Out  . HIV Screening  Discontinued     ----------------------------------------------------------------------------------------------------------------------------------------------------------------------------------------------------------------- Physical Exam BP (!) 179/130 (BP Location: Left Wrist, Patient Position: Sitting, Cuff Size: Large)   Pulse 94   Ht 5\' 7"  (1.702 m)   Wt (!) 359 lb (162.8 kg)   SpO2 100%   BMI 56.23 kg/m   Physical Exam Constitutional:      Appearance: Normal appearance.  Musculoskeletal:     Cervical back: Neck supple.  Skin:    Comments: Swelling and erythema of r index finger extending proximally  to the pip.  TTP.  No fluctuance.   Neurological:     General: No focal deficit present.     Mental Status: She is alert.  Psychiatric:        Mood and Affect: Mood normal.        Behavior: Behavior normal.     ------------------------------------------------------------------------------------------------------------------------------------------------------------------------------------------------------------------- Assessment and Plan  Essential hypertension Start lisinopril 20mg .  Follow up in about 3 weeks for BP recheck and lab follow up for potassium and renal function.   Cellulitis of finger Will see if she has better response  to doxycycline.  Short term norco for pain control.  Referral to hand surgeon to evaluate for deep infection if this isn't improving.    Meds ordered this encounter  Medications  . DISCONTD: doxycycline (VIBRA-TABS) 100 MG tablet    Sig: Take 1 tablet (100 mg total) by mouth 2 (two) times daily for 14 days.    Dispense:  28 tablet    Refill:  0  . DISCONTD: HYDROcodone-acetaminophen (NORCO) 5-325 MG tablet    Sig: Take 1 tablet by mouth every 6 (six) hours as needed for moderate pain.    Dispense:  20 tablet    Refill:  0  . lisinopril (ZESTRIL) 20 MG tablet    Sig: Take 1 tablet (20 mg total) by mouth daily.    Dispense:  90 tablet    Refill:  3  . doxycycline (VIBRA-TABS) 100 MG tablet    Sig: Take 1 tablet (100 mg total) by mouth 2 (two) times daily for 14 days.    Dispense:  28 tablet    Refill:  0  . HYDROcodone-acetaminophen (NORCO) 5-325 MG tablet    Sig: Take 1 tablet by mouth every 6 (six) hours as needed for moderate pain.    Dispense:  20 tablet    Refill:  0    Return in about 3 weeks (around 08/28/2020) for HTN.    This visit occurred during the SARS-CoV-2 public health emergency.  Safety protocols were in place, including screening questions prior to the visit, additional usage of staff PPE, and extensive cleaning of exam room while observing appropriate contact time as indicated for disinfecting solutions.

## 2020-08-07 NOTE — Assessment & Plan Note (Signed)
Will see if she has better response to doxycycline.  Short term norco for pain control.  Referral to hand surgeon to evaluate for deep infection if this isn't improving.

## 2020-08-07 NOTE — Patient Instructions (Signed)
Start doxycycline twice daily x14 days.  Continue mupirocin.  Pain medication as needed.   I have entered referral to hand surgeon.   Start lisinopril 20mg  daily for blood pressure.  See me again in about 2-3 weeks.

## 2020-08-12 ENCOUNTER — Ambulatory Visit (INDEPENDENT_AMBULATORY_CARE_PROVIDER_SITE_OTHER): Payer: Self-pay

## 2020-08-12 ENCOUNTER — Ambulatory Visit (INDEPENDENT_AMBULATORY_CARE_PROVIDER_SITE_OTHER): Payer: Self-pay | Admitting: Physician Assistant

## 2020-08-12 ENCOUNTER — Encounter: Payer: Self-pay | Admitting: Physician Assistant

## 2020-08-12 DIAGNOSIS — L03011 Cellulitis of right finger: Secondary | ICD-10-CM

## 2020-08-12 NOTE — Progress Notes (Signed)
Office Visit Note   Patient: Karen Parks           Date of Birth: Dec 19, 1980           MRN: [id number] Visit Date: 08/12/2020              Requested by: [name], DO [address]  [address] [place],  [place] [address] PCP: [name], DO   Assessment & Plan: Visit Diagnoses:  1. Cellulitis of finger, right     Plan: Given patient's recurrence of right finger infection now 3 times within the last 3 years recommend MRI.  MRI of the right index finger is to rule out abscess also rule out osteomyelitis.  We will have her follow-up after the MRI to go over results discuss further treatment.  She will continue her doxycycline and the application of the Bactroban.  We will have her begin soaking the finger in Dial soap soaks for 15 minutes daily drying completely and then applying the Bactroban.  She will follow-up with [place] sooner if she has any red streaking up the arm or increasing signs of infection.  Follow-Up Instructions: Return After MRI.   Orders:  Orders Placed This Encounter  Procedures  . XR Finger Index Right   No orders of the defined types were placed in this encounter.     Procedures: No procedures performed   Clinical Data: No additional findings.   Subjective: Chief Complaint  Patient presents with  . Right Index Finger - Pain    HPI Karen Parks is a 40 year old female comes in today with right index finger swelling.  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  Then then the year ago she had swelling and drainage involving the right index finger which required antibiotics again.  She reports that in mid April this year she developed burning sensation right index finger and swelling.  She went to urgent care possibly on 26 April where.  In office I&D was performed due to cellulitis involving the right index finger no purulence was  encountered.  After that she developed streaking up her right arm and the antibiotics that she was on changed by her primary care physician and she is currently on doxycycline and using Bactroban.  She is also soaking the finger in Epson salt soaks. She denies any fevers or chills.  She been primarily taking Tylenol for the pain.  Nondiabetic.  Review of Systems See HPI  Objective: Vital Signs: There were no vitals taken for this visit.  Physical Exam Constitutional:      Appearance: She is not ill-appearing or diaphoretic.  Pulmonary:     Effort: Pulmonary effort is normal.  Neurological:     Mental Status: She is oriented to person, place, and time.  Psychiatric:        Mood and Affect: Mood normal.     Ortho Exam Right hand she has obvious cellulitis involving the dorsal aspect of the right index finger.  There is no red streaking up the arm.  She is remove the radial border of the nail.  Small abrasions over the dorsal aspect of the distal phalanx.  No expressible drainage.  She has good flexion and full extension of the right index finger.  No other evidence of cellulitis or impending cellulitis right hand.  Specialty Comments:  No specialty comments available.  Imaging:  XR Finger Index Right  Result Date: 08/12/2020 Right index finger 3 views: No acute fractures and no subluxations.  No evidence of osteomyelitis.  Soft tissue swelling seen in the distal phalanx region.    PMFS History: Patient Active Problem List   Diagnosis Date Noted  . Essential hypertension 08/07/2020  . Cellulitis of finger 08/07/2020  . Abscess around fingernail of right hand 07/27/2019  . Elevated blood pressure reading 07/27/2019  . Recurrent major depression (HCC) 04/25/2016  . Anxiety 04/25/2016  . Migraine headache 04/25/2016   Past Medical History:  Diagnosis Date  . Kidney stone   . Obesity   . Obesity   . Ovarian cyst     History reviewed. No pertinent family history.  Past  Surgical History:  Procedure Laterality Date  . LAPAROSCOPY    . TONSILLECTOMY     Social History   Occupational History  . Not on file  Tobacco Use  . Smoking status: Current Every Day Smoker    Types: Cigarettes  . Smokeless tobacco: Never Used  Vaping Use  . Vaping Use: Never used  Substance and Sexual Activity  . Alcohol use: Yes  . Drug use: No  . Sexual activity: Not Currently

## 2020-08-28 ENCOUNTER — Other Ambulatory Visit: Payer: Self-pay

## 2020-08-28 ENCOUNTER — Encounter: Payer: Self-pay | Admitting: Family Medicine

## 2020-08-28 ENCOUNTER — Ambulatory Visit (INDEPENDENT_AMBULATORY_CARE_PROVIDER_SITE_OTHER): Payer: Self-pay | Admitting: Family Medicine

## 2020-08-28 DIAGNOSIS — I10 Essential (primary) hypertension: Secondary | ICD-10-CM

## 2020-08-28 DIAGNOSIS — F419 Anxiety disorder, unspecified: Secondary | ICD-10-CM

## 2020-08-28 MED ORDER — VALSARTAN 160 MG PO TABS: 160.0000 mg | ORAL_TABLET | Freq: Every day | ORAL | 3 refills | Status: DC

## 2020-08-28 MED ORDER — CLONAZEPAM 1 MG PO TABS: 1.0000 mg | ORAL_TABLET | Freq: Two times a day (BID) | ORAL | 1 refills | Status: DC | PRN

## 2020-08-28 NOTE — Assessment & Plan Note (Signed)
Clonazepam renewed. 

## 2020-08-28 NOTE — Patient Instructions (Signed)
Start valsartan to replace lisinopril.  Let's follow up in 4-6 weeks.  We'll update labs at this visit.

## 2020-08-28 NOTE — Progress Notes (Signed)
Karen Parks - 40 y.o. female MRN [id number]  Date of birth: 15-Nov-1980  Subjective Chief Complaint  Patient presents with  . Hypertension    HPI Karen Parks is a 40 y.o. female here today for follow up of HTN.  HTN is currently treated with lisinopril 20mg  daily.  She reports that she is taking this daily.  She is checking BP at home with readings from 107-150/90-97.  She denies symptoms related to her HTN including chest pain, shortness of breath, palpitations, headache or vision changes.  She does admit to some stress while at work.    ROS:  A comprehensive ROS was completed and negative except as noted per HPI  Allergies  Allergen Reactions  . Amlodipine     Mood swings   . Ampicillin Rash  . Bupropion Palpitations  . Codeine Rash    Past Medical History:  Diagnosis Date  . Kidney stone   . Obesity   . Obesity   . Ovarian cyst     Past Surgical History:  Procedure Laterality Date  . LAPAROSCOPY    . TONSILLECTOMY      Social History   Socioeconomic History  . Marital status: Widowed    Spouse name: Not on file  . Number of children: Not on file  . Years of education: Not on file  . Highest education level: Not on file  Occupational History  . Not on file  Tobacco Use  . Smoking status: Current Every Day Smoker    Types: Cigarettes  . Smokeless tobacco: Never Used  Vaping Use  . Vaping Use: Never used  Substance and Sexual Activity  . Alcohol use: Yes  . Drug use: No  . Sexual activity: Not Currently  Other Topics Concern  . Not on file  Social History Narrative  . Not on file   Social Determinants of Health   Financial Resource Strain: Not on file  Food Insecurity: Not on file  Transportation Needs: Not on file  Physical Activity: Not on file  Stress: Not on file  Social Connections: Not on file    History reviewed. No pertinent family history.  Health Maintenance  Topic Date Due  . Hepatitis C Screening  Never done  . TETANUS/TDAP  Never  done  . PAP SMEAR-Modifier  09/14/2019  . COVID-19 Vaccine (3 - Booster for Pfizer series) 12/10/2019  . INFLUENZA VACCINE  11/04/2020  . HPV VACCINES  Aged Out  . HIV Screening  Discontinued     ----------------------------------------------------------------------------------------------------------------------------------------------------------------------------------------------------------------- Physical Exam BP (!) 180/97 (BP Location: Left Arm, Patient Position: Sitting, Cuff Size: Large)   Pulse (!) 101   Ht 5\' 7"  (1.702 m)   Wt (!) 358 lb (162.4 kg)   BMI 56.07 kg/m   Physical Exam Constitutional:      Appearance: Normal appearance.  HENT:     Head: Normocephalic and atraumatic.  Eyes:     General: No scleral icterus. Cardiovascular:     Rate and Rhythm: Normal rate and regular rhythm.  Pulmonary:     Effort: Pulmonary effort is normal.     Breath sounds: Normal breath sounds.  Musculoskeletal:     Cervical back: Normal range of motion and neck supple.  Neurological:     General: No focal deficit present.     Mental Status: She is alert.  Psychiatric:        Mood and Affect: Mood normal.        Behavior: Behavior normal.     -------------------------------------------------------------------------------------------------------------------------------------------------------------------------------------------------------------------  Assessment and Plan  Essential hypertension BP remains elevated will change lisinopril to valsartan 160mg .  Low sodium diet recommended.  Return in about 5 weeks (around 10/02/2020) for HTN.   Anxiety Clonazepam renewed   Meds ordered this encounter  Medications  . valsartan (DIOVAN) 160 MG tablet    Sig: Take 1 tablet (160 mg total) by mouth daily.    Dispense:  90 tablet    Refill:  3  . clonazePAM (KLONOPIN) 1 MG tablet    Sig: Take 1 tablet (1 mg total) by mouth 2 (two) times daily as needed for anxiety.     Dispense:  60 tablet    Refill:  1    Return in about 5 weeks (around 10/02/2020) for HTN.    This visit occurred during the SARS-CoV-2 public health emergency.  Safety protocols were in place, including screening questions prior to the visit, additional usage of staff PPE, and extensive cleaning of exam room while observing appropriate contact time as indicated for disinfecting solutions.

## 2020-08-28 NOTE — Assessment & Plan Note (Signed)
BP remains elevated will change lisinopril to valsartan 160mg .  Low sodium diet recommended.  Return in about 5 weeks (around 10/02/2020) for HTN.

## 2020-10-02 ENCOUNTER — Ambulatory Visit: Payer: Self-pay | Admitting: Family Medicine

## 2020-12-08 ENCOUNTER — Telehealth: Payer: Self-pay | Admitting: Family

## 2020-12-08 DIAGNOSIS — Z20822 Contact with and (suspected) exposure to covid-19: Secondary | ICD-10-CM

## 2020-12-08 DIAGNOSIS — U071 COVID-19: Secondary | ICD-10-CM

## 2020-12-08 MED ORDER — BENZONATATE 100 MG PO CAPS: 100.0000 mg | ORAL_CAPSULE | Freq: Three times a day (TID) | ORAL | 0 refills | Status: DC | PRN

## 2020-12-08 MED ORDER — MOLNUPIRAVIR EUA 200MG CAPSULE: 4.0000 | ORAL_CAPSULE | Freq: Two times a day (BID) | ORAL | 0 refills | Status: AC

## 2020-12-08 MED ORDER — FLUTICASONE PROPIONATE 50 MCG/ACT NA SUSP: 2.0000 | Freq: Every day | NASAL | 6 refills | Status: DC

## 2020-12-08 NOTE — Progress Notes (Signed)
E-Visit for Corona Virus Screening  Your current symptoms could be consistent with the coronavirus.  Many health care providers can now test patients at their office but not all are.  [facility] has multiple testing sites. For information on our COVID testing locations and hours go to [email or web address]  We are enrolling you in our MyChart Home Monitoring for COVID19 . Daily you will receive a questionnaire within the MyChart website. Our COVID 19 response team will be monitoring your responses daily.  Testing Information: The COVID-19 Community Testing sites are testing BY APPOINTMENT ONLY.  You can schedule online at [email or web address]  If you do not have access to a smart phone or computer you may call [phone] for an appointment.   Additional testing sites in the Community:  For [facility] Testing sites in [place]  https://www.[facility].com/minuteclinic/covid-19-testing  For Pop-up testing sites in [place]  https://covid19.ncdhhs.gov/about-covid-19/testing/find-my-testing-place/pop-testing-sites  For [facility] https://[place].gov[place]/human-services/health-department/coronavirus-covid-19-info/covid-19-testing  For [place] testing in [place] and [place] https://www.[place].gov/[place]/human-services/health-department/coronavirus-covid-19-info/covid-19-testing  For [facility] testing in [place]   https://[facility].care/covidtesting  For  more information about community testing call [phone]   Please quarantine yourself while awaiting your test results. Please stay home for a minimum of 10 days from the first day of illness with improving symptoms and you have had 24 hours of no fever (without the use of Tylenol (Acetaminophen) Motrin (Ibuprofen) or any fever reducing medication).  Also - Do not get tested prior to returning to work because once you have had a positive test the test can stay positive for  more than a month in some cases.   You should wear a mask or cloth face covering over your nose and mouth if you must be around other people or animals, including pets (even at home). Try to stay at least 6 feet away from other people. This will protect the people around you.  Please continue good preventive care measures, including:  frequent hand-washing, avoid touching your face, cover coughs/sneezes, stay out of crowds and keep a 6 foot distance from others.  COVID-19 is a respiratory illness with symptoms that are similar to the flu. Symptoms are typically mild to moderate, but there have been cases of severe illness and death due to the virus.   The following symptoms may appear 2-14 days after exposure: Fever Cough Shortness of breath or difficulty breathing Chills Repeated shaking with chills Muscle pain Headache Sore throat New loss of taste or smell Fatigue Congestion or runny nose Nausea or vomiting Diarrhea  Go to the nearest hospital ED for assessment if fever/cough/breathlessness are severe or illness seems like a threat to life.  It is vitally important that if you feel that you have an infection such as this virus or any other virus that you stay home and away from places where you may spread it to others.  You should avoid contact with people age 65 and older.   You can use medication such as prescription cough medication called Tessalon Perles 100 mg. You may take 1-2 capsules every 8 hours as needed for cough and prescription for Fluticasone nasal spray 2 sprays in each nostril one time per day  You may also take acetaminophen (Tylenol) as needed for fever.  Reduce your risk of any infection by using the same precautions used for avoiding the common cold or flu:  Wash your hands often with soap and warm water for at least 20 seconds.  If soap and water are not readily available, use an alcohol-based   hand sanitizer with at least 60% alcohol.  If coughing or sneezing,  cover your mouth and nose by coughing or sneezing into the elbow areas of your shirt or coat, into a tissue or into your sleeve (not your hands). Avoid shaking hands with others and consider head nods or verbal greetings only. Avoid touching your eyes, nose, or mouth with unwashed hands.  Avoid close contact with people who are sick. Avoid places or events with large numbers of people in one location, like concerts or sporting events. Carefully consider travel plans you have or are making. If you are planning any travel outside or inside the [place], visit the CDC's Travelers' Health webpage for the latest health notices. If you have some symptoms but not all symptoms, continue to monitor at home and seek medical attention if your symptoms worsen. If you are having a medical emergency, call 911.  HOME CARE Only take medications as instructed by your medical team. Drink plenty of fluids and get plenty of rest. A steam or ultrasonic humidifier can help if you have congestion.   GET HELP RIGHT AWAY IF YOU HAVE EMERGENCY WARNING SIGNS** FOR COVID-19. If you or someone is showing any of these signs seek emergency medical care immediately. Call 911 or proceed to your closest emergency facility if: You develop worsening high fever. Trouble breathing Bluish lips or face Persistent pain or pressure in the chest New confusion Inability to wake or stay awake You cough up blood. Your symptoms become more severe  **This list is not all possible symptoms. Contact your medical provider for any symptoms that are sever or concerning to you.  MAKE SURE YOU  Understand these instructions. Will watch your condition. Will get help right away if you are not doing well or get worse.  Your e-visit answers were reviewed by a board certified advanced clinical practitioner to complete your personal care plan.  Depending on the condition, your plan could have included both over the counter or prescription  medications.  If there is a problem please reply once you have received a response from your provider.  Your safety is important to us.  If you have drug allergies check your prescription carefully.    You can use MyChart to ask questions about today's visit, request a non-urgent call back, or ask for a work or school excuse for 24 hours related to this e-Visit. If it has been greater than 24 hours you will need to follow up with your provider, or enter a new e-Visit to address those concerns. You will get an e-mail in the next two days asking about your experience.  I hope that your e-visit has been valuable and will speed your recovery. Thank you for using e-visits.   Approximately 5 minutes was spent documenting and reviewing patient's chart.  

## 2020-12-08 NOTE — Progress Notes (Signed)
Virtual Visit Consent   Karen Parks, you are scheduled for a virtual visit with a [facility] provider today.     Just as with appointments in the office, your consent must be obtained to participate.  Your consent will be active for this visit and any virtual visit you may have with one of our providers in the next 365 days.     If you have a MyChart account, a copy of this consent can be sent to you electronically.  All virtual visits are billed to your insurance company just like a traditional visit in the office.    As this is a virtual visit, video technology does not allow for your provider to perform a traditional examination.  This may limit your provider's ability to fully assess your condition.  If your provider identifies any concerns that need to be evaluated in person or the need to arrange testing (such as labs, EKG, etc.), we will make arrangements to do so.     Although advances in technology are sophisticated, we cannot ensure that it will always work on either your end or our end.  If the connection with a video visit is poor, the visit may have to be switched to a telephone visit.  With either a video or telephone visit, we are not always able to ensure that we have a secure connection.     I need to obtain your verbal consent now.   Are you willing to proceed with your visit today?    Karen Parks has provided verbal consent on 12/08/2020 for a virtual visit (video or telephone).   Karen Rodney, FNP   Date: 12/08/2020 6:32 PM   Virtual Visit via Video Note   I, Karen Parks, connected with  Karen Parks  ([id number], 01-29-1981) on 12/08/20 at  6:30 PM EDT by a video-enabled telemedicine application and verified that I am speaking with the correct person using two identifiers.  Location: Patient: Virtual Visit Location Patient: Home Provider: Virtual Visit Location Provider: Home   I discussed the limitations of evaluation and management by telemedicine and the  availability of in person appointments. The patient expressed understanding and agreed to proceed.    History of Present Illness: Karen Parks is a 40 y.o. who identifies as a female who was assigned female at birth, and is being seen today for COVID. She states her symptoms started yesterday and tested positive today.   HPI: Cough This is a new problem. The current episode started yesterday. The problem has been gradually worsening. The problem occurs every few minutes. The cough is Productive of sputum. Associated symptoms include chills, ear congestion, ear pain, a fever, headaches, myalgias, nasal congestion, postnasal drip, rhinorrhea, a sore throat, shortness of breath and wheezing. She has tried rest and OTC cough suppressant for the symptoms. The treatment provided mild relief.   Problems:  Patient Active Problem List   Diagnosis Date Noted  . Essential hypertension 08/07/2020  . Cellulitis of finger 08/07/2020  . Abscess around fingernail of right hand 07/27/2019  . Elevated blood pressure reading 07/27/2019  . Recurrent major depression (HCC) 04/25/2016  . Anxiety 04/25/2016  . Migraine headache 04/25/2016    Allergies:  Allergies  Allergen Reactions  . Amlodipine     Mood swings   . Ampicillin Rash  . Bupropion Palpitations  . Codeine Rash   Medications:  Current Outpatient Medications:  .  molnupiravir EUA 200 mg CAPS, Take 4 capsules (800 mg total) by mouth 2 (  two) times daily for 5 days., Disp: 40 capsule, Rfl: 0 .  benzonatate (TESSALON PERLES) 100 MG capsule, Take 1 capsule (100 mg total) by mouth 3 (three) times daily as needed., Disp: 20 capsule, Rfl: 0 .  clonazePAM (KLONOPIN) 1 MG tablet, Take 1 tablet (1 mg total) by mouth 2 (two) times daily as needed for anxiety., Disp: 60 tablet, Rfl: 1 .  fluticasone (FLONASE) 50 MCG/ACT nasal spray, Place 2 sprays into both nostrils daily., Disp: 16 g, Rfl: 6 .  mupirocin ointment (BACTROBAN) 2 %, Massage into affected area  3-4 times daily after each hot soak., Disp: , Rfl:  .  valsartan (DIOVAN) 160 MG tablet, Take 1 tablet (160 mg total) by mouth daily., Disp: 90 tablet, Rfl: 3  Observations/Objective: Patient is well-developed, well-nourished in no acute distress.  Resting comfortably  at home.  Head is normocephalic, atraumatic.  No labored breathing.  Speech is clear and coherent with logical content.  Patient is alert and oriented at baseline.  Morbid obese Looks ill  Assessment and Plan: 1. COVID-19 virus detected - molnupiravir EUA 200 mg CAPS; Take 4 capsules (800 mg total) by mouth 2 (two) times daily for 5 days.  Dispense: 40 capsule; Refill: 0 - MyChart COVID-19 home monitoring program; Future  2. Morbid obesity (HCC) COVID positive, rest, force fluids, tylenol as needed, Quarantine for at least 5 days and fever free and the next 5 days wearing a mask while out in public, report any worsening symptoms such as increased shortness of breath, swelling, or continued high fevers.    Follow Up Instructions: I discussed the assessment and treatment plan with the patient. The patient was provided an opportunity to ask questions and all were answered. The patient agreed with the plan and demonstrated an understanding of the instructions.  A copy of instructions were sent to the patient via MyChart.  The patient was advised to call back or seek an in-person evaluation if the symptoms worsen or if the condition fails to improve as anticipated.  Time:  I spent 7 minutes with the patient via telehealth technology discussing the above problems/concerns.    Karen Rodney, FNP

## 2022-08-12 ENCOUNTER — Encounter (HOSPITAL_COMMUNITY): Payer: Self-pay

## 2022-08-12 ENCOUNTER — Ambulatory Visit (HOSPITAL_COMMUNITY)
Admission: EM | Admit: 2022-08-12 | Discharge: 2022-08-12 | Disposition: A | Discharge status: Home / Self Care | Payer: BC Managed Care – PPO | Attending: Internal Medicine | Admitting: Internal Medicine

## 2022-08-12 DIAGNOSIS — J029 Acute pharyngitis, unspecified: Secondary | ICD-10-CM | POA: Insufficient documentation

## 2022-08-12 DIAGNOSIS — F17210 Nicotine dependence, cigarettes, uncomplicated: Secondary | ICD-10-CM | POA: Insufficient documentation

## 2022-08-12 DIAGNOSIS — Z1152 Encounter for screening for COVID-19: Secondary | ICD-10-CM | POA: Diagnosis not present

## 2022-08-12 DIAGNOSIS — R0981 Nasal congestion: Secondary | ICD-10-CM | POA: Diagnosis not present

## 2022-08-12 DIAGNOSIS — J0100 Acute maxillary sinusitis, unspecified: Secondary | ICD-10-CM | POA: Insufficient documentation

## 2022-08-12 HISTORY — DX: Essential (primary) hypertension: I10

## 2022-08-12 LAB — SARS CORONAVIRUS 2 (TAT 6-24 HRS): SARS Coronavirus 2: NEGATIVE

## 2022-08-12 LAB — POCT RAPID STREP A (OFFICE): Rapid Strep A Screen: NEGATIVE

## 2022-08-12 MED ORDER — LIDOCAINE VISCOUS HCL 2 % MT SOLN: 15.0000 mL | OROMUCOSAL | 0 refills | Status: DC | PRN

## 2022-08-12 MED ORDER — DOXYCYCLINE HYCLATE 100 MG PO CAPS: 100.0000 mg | ORAL_CAPSULE | Freq: Two times a day (BID) | ORAL | 0 refills | Status: DC

## 2022-08-12 NOTE — Discharge Instructions (Addendum)
Use lidocaine swish and spit for throat irritation Take antibiotic doxycycline 2x per day

## 2022-08-12 NOTE — ED Provider Notes (Signed)
Karen Parks Karen Parks   [id number] 08/12/22 Arrival Time: 4098  ASSESSMENT & PLAN:  1. Acute non-recurrent maxillary sinusitis    -History and exam is consistent with early acute sinusitis.  Strep throat is negative, lab culture pending.  Will treat empirically for sinusitis with doxycycline twice daily x 10 days.  Also prescribe viscous lidocaine to use as needed for sore throat.  All questions answered and she agrees to plan.  Meds ordered this encounter  Medications   doxycycline (VIBRAMYCIN) 100 MG capsule    Sig: Take 1 capsule (100 mg total) by mouth 2 (two) times daily.    Dispense:  20 capsule    Refill:  0   lidocaine (XYLOCAINE) 2 % solution    Sig: Use as directed 15 mLs in the mouth or throat as needed for mouth pain.    Dispense:  100 mL    Refill:  0     Discharge Instructions      Use lidocaine swish and spit for throat irritation Take antibiotic doxycycline 2x per day       Follow-up Information     Schedule an appointment as soon as possible for a visit  with Karen Coombe, DO.   Specialty: Family Medicine Why: to check BP Contact information: [address]  [address] [place] [address] [phone]                  Reviewed expectations re: course of current medical issues. Questions answered. Outlined signs and symptoms indicating need for more acute intervention. Patient verbalized understanding. After Visit Summary given.   SUBJECTIVE: Pleasant 42 year old female here for evaluation of sinus congestion, facial pain, and sore throat.  Symptoms have been going on for about 3 days. She has productive cough with white sputum.  She has been using throat sprays, nasal sprays, and Sudafed without much relief.  Denies any fevers.  No chest pain or shortness of breath.  No LMP recorded. (Menstrual status: IUD). Past Surgical History:  Procedure Laterality Date   LAPAROSCOPY     TONSILLECTOMY       OBJECTIVE:  Vitals:    08/12/22 0824  BP: (!) 181/135  Pulse: 91  Resp: 16  Temp: 98.3 F (36.8 C)  TempSrc: Oral  SpO2: 96%     Physical Exam Vitals and nursing note reviewed.  Constitutional:      General: She is not in acute distress. HENT:     Right Ear: Tympanic membrane normal.     Left Ear: Tympanic membrane normal.     Nose: Congestion present.     Right Turbinates: Enlarged.     Left Turbinates: Enlarged.     Right Sinus: Maxillary sinus tenderness present.     Left Sinus: Maxillary sinus tenderness present.     Mouth/Throat:     Pharynx: Posterior oropharyngeal erythema present. No oropharyngeal exudate.  Cardiovascular:     Rate and Rhythm: Normal rate.  Pulmonary:     Effort: Pulmonary effort is normal.  Musculoskeletal:        General: Normal range of motion.     Cervical back: Normal range of motion.  Lymphadenopathy:     Cervical: Cervical adenopathy present.  Neurological:     General: No focal deficit present.  Psychiatric:        Mood and Affect: Mood normal.      Labs: Results for orders placed or performed during the Parks encounter of 08/12/22  POC rapid strep A  Result Value Ref Range   Rapid Strep A Screen Negative Negative   Labs Reviewed  SARS CORONAVIRUS 2 (TAT 6-24 HRS)  CULTURE, GROUP A STREP [facility])  POCT RAPID STREP A (OFFICE)    Imaging: No results found.   Allergies  Allergen Reactions   Amlodipine     Mood swings    Ampicillin Rash   Bupropion Palpitations   Codeine Rash                                               Past Medical History:  Diagnosis Date   Hypertension    Kidney stone    Obesity    Obesity    Ovarian cyst     Social History   Socioeconomic History   Marital status: Widowed    Spouse name: Not on file   Number of children: Not on file   Years of education: Not on file   Highest education level: Not on file  Occupational History   Not on file  Tobacco Use   Smoking status: Every Day    Packs/day: .25     Types: Cigarettes   Smokeless tobacco: Never  Vaping Use   Vaping Use: Never used  Substance and Sexual Activity   Alcohol use: Yes   Drug use: No   Sexual activity: Not Currently  Other Topics Concern   Not on file  Social History Narrative   Not on file   Social Determinants of Health   Financial Resource Strain: Not on file  Food Insecurity: Not on file  Transportation Needs: Not on file  Physical Activity: Not on file  Stress: Not on file  Social Connections: Not on file  Intimate Partner Violence: Not on file    Family History  Problem Relation Age of Onset   Osteoarthritis Mother    Asthma Mother    Thyroid disease Mother    Cancer Mother    Scoliosis Mother       Karen Nigh, MD 08/12/22 [phone]

## 2022-08-12 NOTE — ED Triage Notes (Signed)
Patient states she has a productive cough with white sputum, nasal congestion, and a sore throat x 3 days.  Patient states she has taken a throat spray, nasal spray, Sudafed with very little relief.

## 2022-08-14 LAB — CULTURE, GROUP A STREP (THRC)

## 2023-01-22 ENCOUNTER — Encounter: Payer: Self-pay | Admitting: Family Medicine

## 2023-01-22 ENCOUNTER — Ambulatory Visit (INDEPENDENT_AMBULATORY_CARE_PROVIDER_SITE_OTHER): Payer: BC Managed Care – PPO | Admitting: Family Medicine

## 2023-01-22 VITALS — BP 163/97 | HR 81 | Ht 67.0 in | Wt 359.0 lb

## 2023-01-22 DIAGNOSIS — F331 Major depressive disorder, recurrent, moderate: Secondary | ICD-10-CM | POA: Diagnosis not present

## 2023-01-22 DIAGNOSIS — I10 Essential (primary) hypertension: Secondary | ICD-10-CM | POA: Diagnosis not present

## 2023-01-22 DIAGNOSIS — Z23 Encounter for immunization: Secondary | ICD-10-CM

## 2023-01-22 MED ORDER — VALSARTAN 160 MG PO TABS: 160.0000 mg | ORAL_TABLET | Freq: Every day | ORAL | 1 refills | Status: AC

## 2023-01-22 MED ORDER — CLONAZEPAM 1 MG PO TABS: 1.0000 mg | ORAL_TABLET | Freq: Two times a day (BID) | ORAL | 1 refills | Status: AC | PRN

## 2023-01-22 MED ORDER — SERTRALINE HCL 50 MG PO TABS: ORAL_TABLET | ORAL | 3 refills | Status: AC

## 2023-01-22 NOTE — Patient Instructions (Signed)
Start valsartan for BP.  Start sertraline for mood.  Clonazepam as needed for sleep/severe anxiety.  Try to limit to only severe symptoms.  See me again in about 3 weeks.

## 2023-01-22 NOTE — Assessment & Plan Note (Signed)
BP elevated.  Adding valsartan and encouraged continued dietary and exercise changes.  Return in 2-3 weeks.

## 2023-01-22 NOTE — Progress Notes (Signed)
Karen Parks - 42 y.o. female MRN [id number]  Date of birth: May 23, 1980  Subjective Chief Complaint  Patient presents with   Hypertension    HPI Karen Parks is a 42 y.o. female here today for follow up visit.   She has noted elevated BP readings at home recently.  Has history of HTN that was managed with valsartan.  She is not taking this and admits that she really didn't take this previously either.  She has been trying to work on incorporation of more exercise.  Starting weight before going to the gym was 371lbs, down to 359lbs today.  She does admit to increased stress and anxiety along with poor sleep.  She has taken sertraline with clonazepam which worked pretty well for her in the past.  She would be interested in adding this back on.  She denies symptoms related to HTN including chest pain, shortness of breath, palpitations, headache or vision changes.     ROS:  A comprehensive ROS was completed and negative except as noted per HPI  Allergies  Allergen Reactions   Amlodipine     Mood swings    Ampicillin Rash   Bupropion Palpitations   Codeine Rash    Past Medical History:  Diagnosis Date   Hypertension    Kidney stone    Obesity    Obesity    Ovarian cyst     Past Surgical History:  Procedure Laterality Date   LAPAROSCOPY     TONSILLECTOMY      Social History   Socioeconomic History   Marital status: Widowed    Spouse name: Not on file   Number of children: Not on file   Years of education: Not on file   Highest education level: Some college, no degree  Occupational History   Not on file  Tobacco Use   Smoking status: Every Day    Current packs/day: 0.25    Types: Cigarettes   Smokeless tobacco: Never  Vaping Use   Vaping status: Never Used  Substance and Sexual Activity   Alcohol use: Yes   Drug use: No   Sexual activity: Not Currently  Other Topics Concern   Not on file  Social History Narrative   Not on file   Social Determinants of Health    Financial Resource Strain: Medium Risk (01/21/2023)   Overall Financial Resource Strain (CARDIA)    Difficulty of Paying Living Expenses: Somewhat hard  Food Insecurity: No Food Insecurity (01/21/2023)   Hunger Vital Sign    Worried About Running Out of Food in the Last Year: Never true    Ran Out of Food in the Last Year: Never true  Transportation Needs: No Transportation Needs (01/21/2023)   PRAPARE - Administrator, Civil Service (Medical): No    Lack of Transportation (Non-Medical): No  Physical Activity: Insufficiently Active (01/21/2023)   Exercise Vital Sign    Days of Exercise per Week: 3 days    Minutes of Exercise per Session: 30 min  Stress: Stress Concern Present (01/21/2023)   [employer] of Occupational Health - Occupational Stress Questionnaire    Feeling of Stress : To some extent  Social Connections: Socially Isolated (01/21/2023)   Social Connection and Isolation Panel [NHANES]    Frequency of Communication with Friends and Family: Once a week    Frequency of Social Gatherings with Friends and Family: Never    Attends Religious Services: Never    Database administrator or Organizations: No  Attends Banker Meetings: Not on file    Marital Status: Living with partner    Family History  Problem Relation Age of Onset   Osteoarthritis Mother    Asthma Mother    Thyroid disease Mother    Cancer Mother    Scoliosis Mother     Health Maintenance  Topic Date Due   Hepatitis C Screening  Never done   DTaP/Tdap/Td (1 - Tdap) Never done   Cervical Cancer Screening (HPV/Pap Cotest)  12/27/2017   INFLUENZA VACCINE  11/05/2022   COVID-19 Vaccine (3 - 2023-24 season) 12/06/2022   HPV VACCINES  Aged Out   HIV Screening  Discontinued      ----------------------------------------------------------------------------------------------------------------------------------------------------------------------------------------------------------------- Physical Exam BP (!) 152/95   Pulse 81   Ht 5\' 7"  (1.702 m)   Wt (!) 359 lb (162.8 kg)   SpO2 97%   BMI 56.23 kg/m   Physical Exam Constitutional:      Appearance: Normal appearance.  HENT:     Head: Normocephalic and atraumatic.  Cardiovascular:     Rate and Rhythm: Normal rate and regular rhythm.  Pulmonary:     Effort: Pulmonary effort is normal.     Breath sounds: Normal breath sounds.  Neurological:     General: No focal deficit present.     Mental Status: She is alert.  Psychiatric:        Mood and Affect: Mood normal.     Comments: Appropriately tearful when discussing stress.      ------------------------------------------------------------------------------------------------------------------------------------------------------------------------------------------------------------------- Assessment and Plan  Recurrent major depression (HCC) She did pretty well with sertraline in the past.  Adding this back on at 25mg x1 week then increase to 50mg .  Renewed clonazepam to use as needed.   Essential hypertension BP elevated.  Adding valsartan and encouraged continued dietary and exercise changes.  Return in 2-3 weeks.    Meds ordered this encounter  Medications   sertraline (ZOLOFT) 50 MG tablet    Sig: Take 1/2 tab po daily x7 days then icrease to full tab    Dispense:  30 tablet    Refill:  3   clonazePAM (KLONOPIN) 1 MG tablet    Sig: Take 1 tablet (1 mg total) by mouth 2 (two) times daily as needed for anxiety.    Dispense:  45 tablet    Refill:  1   valsartan (DIOVAN) 160 MG tablet    Sig: Take 1 tablet (160 mg total) by mouth daily.    Dispense:  90 tablet    Refill:  1    Return in about 3 weeks (around 02/12/2023) for Hypertension,  Mood/BH.    This visit occurred during the SARS-CoV-2 public health emergency.  Safety protocols were in place, including screening questions prior to the visit, additional usage of staff PPE, and extensive cleaning of exam room while observing appropriate contact time as indicated for disinfecting solutions.

## 2023-01-22 NOTE — Assessment & Plan Note (Signed)
She did pretty well with sertraline in the past.  Adding this back on at 25mg x1 week then increase to 50mg .  Renewed clonazepam to use as needed.

## 2023-01-23 LAB — CBC WITH DIFFERENTIAL/PLATELET
Basophils Absolute: 0 10*3/uL (ref 0.0–0.2)
Basos: 1 %
EOS (ABSOLUTE): 0.4 10*3/uL (ref 0.0–0.4)
Eos: 7 %
Hematocrit: 46.9 % — ABNORMAL HIGH (ref 34.0–46.6)
Hemoglobin: 15.3 g/dL (ref 11.1–15.9)
Immature Grans (Abs): 0 10*3/uL (ref 0.0–0.1)
Immature Granulocytes: 1 %
Lymphocytes Absolute: 1.7 10*3/uL (ref 0.7–3.1)
Lymphs: 29 %
MCH: 29.8 pg (ref 26.6–33.0)
MCHC: 32.6 g/dL (ref 31.5–35.7)
MCV: 91 fL (ref 79–97)
Monocytes Absolute: 0.4 10*3/uL (ref 0.1–0.9)
Monocytes: 6 %
Neutrophils Absolute: 3.4 10*3/uL (ref 1.4–7.0)
Neutrophils: 56 %
Platelets: 281 10*3/uL (ref 150–450)
RBC: 5.14 x10E6/uL (ref 3.77–5.28)
RDW: 12.7 % (ref 11.7–15.4)
WBC: 5.9 10*3/uL (ref 3.4–10.8)

## 2023-01-23 LAB — CMP14+EGFR
ALT: 16 [IU]/L (ref 0–32)
AST: 16 [IU]/L (ref 0–40)
Albumin: 4.3 g/dL (ref 3.9–4.9)
Alkaline Phosphatase: 111 [IU]/L (ref 44–121)
BUN/Creatinine Ratio: 19 (ref 9–23)
BUN: 11 mg/dL (ref 6–24)
Bilirubin Total: 0.3 mg/dL (ref 0.0–1.2)
CO2: 20 mmol/L (ref 20–29)
Calcium: 9.1 mg/dL (ref 8.7–10.2)
Chloride: 104 mmol/L (ref 96–106)
Creatinine, Ser: 0.58 mg/dL (ref 0.57–1.00)
Globulin, Total: 2.1 g/dL (ref 1.5–4.5)
Glucose: 75 mg/dL (ref 70–99)
Potassium: 4.1 mmol/L (ref 3.5–5.2)
Sodium: 140 mmol/L (ref 134–144)
Total Protein: 6.4 g/dL (ref 6.0–8.5)
eGFR: 115 mL/min/{1.73_m2} (ref >59)

## 2023-01-23 LAB — TSH: TSH: 1.83 u[IU]/mL (ref 0.450–4.500)

## 2023-02-16 ENCOUNTER — Ambulatory Visit: Payer: BC Managed Care – PPO | Admitting: Family Medicine

## 2023-02-23 ENCOUNTER — Encounter: Payer: Self-pay | Admitting: Family Medicine

## 2023-02-23 ENCOUNTER — Ambulatory Visit (INDEPENDENT_AMBULATORY_CARE_PROVIDER_SITE_OTHER): Payer: BC Managed Care – PPO | Admitting: Family Medicine

## 2023-02-23 VITALS — BP 153/97 | HR 87 | Ht 67.0 in | Wt 354.0 lb

## 2023-02-23 DIAGNOSIS — I10 Essential (primary) hypertension: Secondary | ICD-10-CM | POA: Diagnosis not present

## 2023-02-23 DIAGNOSIS — F419 Anxiety disorder, unspecified: Secondary | ICD-10-CM | POA: Diagnosis not present

## 2023-02-23 NOTE — Progress Notes (Signed)
Karen Parks - 42 y.o. female MRN [id number]  Date of birth: 03-15-1981  Subjective Chief Complaint  Patient presents with   Follow-up    HPI Karen Parks is a 42 y.o. female here today for follow up visit.   She was seen about 1 month ago.  Restarted back on sertraline and clonazepam for anxiety.  Reports that this seems to be working pretty well for her.  She has not noted any significant side effects with this.    BP is elevated on initial check.   She is taking valsartan as directed.  She denies side effects.  She is not monitoring BP at home.    ROS:  A comprehensive ROS was completed and negative except as noted per HPI  Allergies  Allergen Reactions   Amlodipine     Mood swings    Ampicillin Rash   Bupropion Palpitations   Codeine Rash    Past Medical History:  Diagnosis Date   Hypertension    Kidney stone    Obesity    Obesity    Ovarian cyst     Past Surgical History:  Procedure Laterality Date   LAPAROSCOPY     TONSILLECTOMY      Social History   Socioeconomic History   Marital status: Widowed    Spouse name: Not on file   Number of children: Not on file   Years of education: Not on file   Highest education level: Some college, no degree  Occupational History   Not on file  Tobacco Use   Smoking status: Every Day    Current packs/day: 0.25    Types: Cigarettes   Smokeless tobacco: Never  Vaping Use   Vaping status: Never Used  Substance and Sexual Activity   Alcohol use: Yes   Drug use: No   Sexual activity: Not Currently  Other Topics Concern   Not on file  Social History Narrative   Not on file   Social Determinants of Health   Financial Resource Strain: Medium Risk (01/21/2023)   Overall Financial Resource Strain (CARDIA)    Difficulty of Paying Living Expenses: Somewhat hard  Food Insecurity: No Food Insecurity (01/21/2023)   Hunger Vital Sign    Worried About Running Out of Food in the Last Year: Never true    Ran Out of Food in  the Last Year: Never true  Transportation Needs: No Transportation Needs (01/21/2023)   PRAPARE - Administrator, Civil Service (Medical): No    Lack of Transportation (Non-Medical): No  Physical Activity: Insufficiently Active (01/21/2023)   Exercise Vital Sign    Days of Exercise per Week: 3 days    Minutes of Exercise per Session: 30 min  Stress: Stress Concern Present (01/21/2023)   [employer] of Occupational Health - Occupational Stress Questionnaire    Feeling of Stress : To some extent  Social Connections: Socially Isolated (01/21/2023)   Social Connection and Isolation Panel [NHANES]    Frequency of Communication with Friends and Family: Once a week    Frequency of Social Gatherings with Friends and Family: Never    Attends Religious Services: Never    Database administrator or Organizations: No    Attends Engineer, structural: Not on file    Marital Status: Living with partner    Family History  Problem Relation Age of Onset   Osteoarthritis Mother    Asthma Mother    Thyroid disease Mother    Cancer Mother  Scoliosis Mother     Health Maintenance  Topic Date Due   Hepatitis C Screening  Never done   Cervical Cancer Screening (HPV/Pap Cotest)  12/27/2017   COVID-19 Vaccine (3 - 2023-24 season) 12/06/2022   DTaP/Tdap/Td (2 - Td or Tdap) 01/21/2033   INFLUENZA VACCINE  Completed   HPV VACCINES  Aged Out   HIV Screening  Discontinued     ----------------------------------------------------------------------------------------------------------------------------------------------------------------------------------------------------------------- Physical Exam BP (!) 153/97   Pulse 87   Ht 5\' 7"  (1.702 m)   Wt (!) 354 lb (160.6 kg)   SpO2 96%   BMI 55.44 kg/m   Physical Exam Constitutional:      Appearance: Normal appearance.  Cardiovascular:     Rate and Rhythm: Normal rate and regular rhythm.  Pulmonary:     Effort:  Pulmonary effort is normal.     Breath sounds: Normal breath sounds.  Neurological:     General: No focal deficit present.     Mental Status: She is alert.  Psychiatric:        Mood and Affect: Mood normal.        Behavior: Behavior normal.     ------------------------------------------------------------------------------------------------------------------------------------------------------------------------------------------------------------------- Assessment and Plan  Essential hypertension BP elevated.  She is taking valsartan as directed.  She will monitor readings at home and let me know what these look like after a couple of weeks.   Anxiety Improved with sertraline at current strength.  She is using clonazepam some as well.  Discussed trying to limit this as much as possible.  Encouraged continued exercise to manage anxiety as well.    No orders of the defined types were placed in this encounter.   No follow-ups on file.    This visit occurred during the SARS-CoV-2 public health emergency.  Safety protocols were in place, including screening questions prior to the visit, additional usage of staff PPE, and extensive cleaning of exam room while observing appropriate contact time as indicated for disinfecting solutions.

## 2023-02-23 NOTE — Assessment & Plan Note (Signed)
BP elevated.  She is taking valsartan as directed.  She will monitor readings at home and let me know what these look like after a couple of weeks.

## 2023-02-23 NOTE — Assessment & Plan Note (Signed)
Improved with sertraline at current strength.  She is using clonazepam some as well.  Discussed trying to limit this as much as possible.  Encouraged continued exercise to manage anxiety as well.

## 2023-03-16 DIAGNOSIS — Z01419 Encounter for gynecological examination (general) (routine) without abnormal findings: Secondary | ICD-10-CM | POA: Diagnosis not present

## 2023-03-16 DIAGNOSIS — Z30433 Encounter for removal and reinsertion of intrauterine contraceptive device: Secondary | ICD-10-CM | POA: Diagnosis not present

## 2023-03-16 DIAGNOSIS — Z3202 Encounter for pregnancy test, result negative: Secondary | ICD-10-CM | POA: Diagnosis not present
